# Patient Record
Sex: Male | Born: 1970 | Race: White | Hispanic: No | Marital: Single | Smoking: Never smoker
Health system: Southern US, Community
[De-identification: ages and names within clinical notes are randomized; demographics above are authoritative.]

## PROBLEM LIST (undated history)

## (undated) DIAGNOSIS — E119 Type 2 diabetes mellitus without complications: Secondary | ICD-10-CM

## (undated) DIAGNOSIS — E785 Hyperlipidemia, unspecified: Secondary | ICD-10-CM

## (undated) DIAGNOSIS — I10 Essential (primary) hypertension: Secondary | ICD-10-CM

## (undated) DIAGNOSIS — Z87442 Personal history of urinary calculi: Secondary | ICD-10-CM

## (undated) HISTORY — DX: Essential (primary) hypertension: I10

## (undated) HISTORY — DX: Personal history of urinary calculi: Z87.442

## (undated) HISTORY — DX: Type 2 diabetes mellitus without complications: E11.9

## (undated) HISTORY — DX: Hyperlipidemia, unspecified: E78.5

## (undated) HISTORY — PX: TONSILLECTOMY: SUR1361

---

## 2007-02-13 ENCOUNTER — Emergency Department (HOSPITAL_COMMUNITY): Admission: EM | Admit: 2007-02-13 | Discharge: 2007-02-13 | Payer: Self-pay | Admitting: Emergency Medicine

## 2008-07-13 IMAGING — CR DG FINGER THUMB 2+V*R*
1 series · 1 of 1 positions shown · non-contrast
Comparison: none

CLINICAL DATA: 36-year-old with laceration of the finger.
 RIGHT THUMB ? 3 VIEW:

[view not recorded]
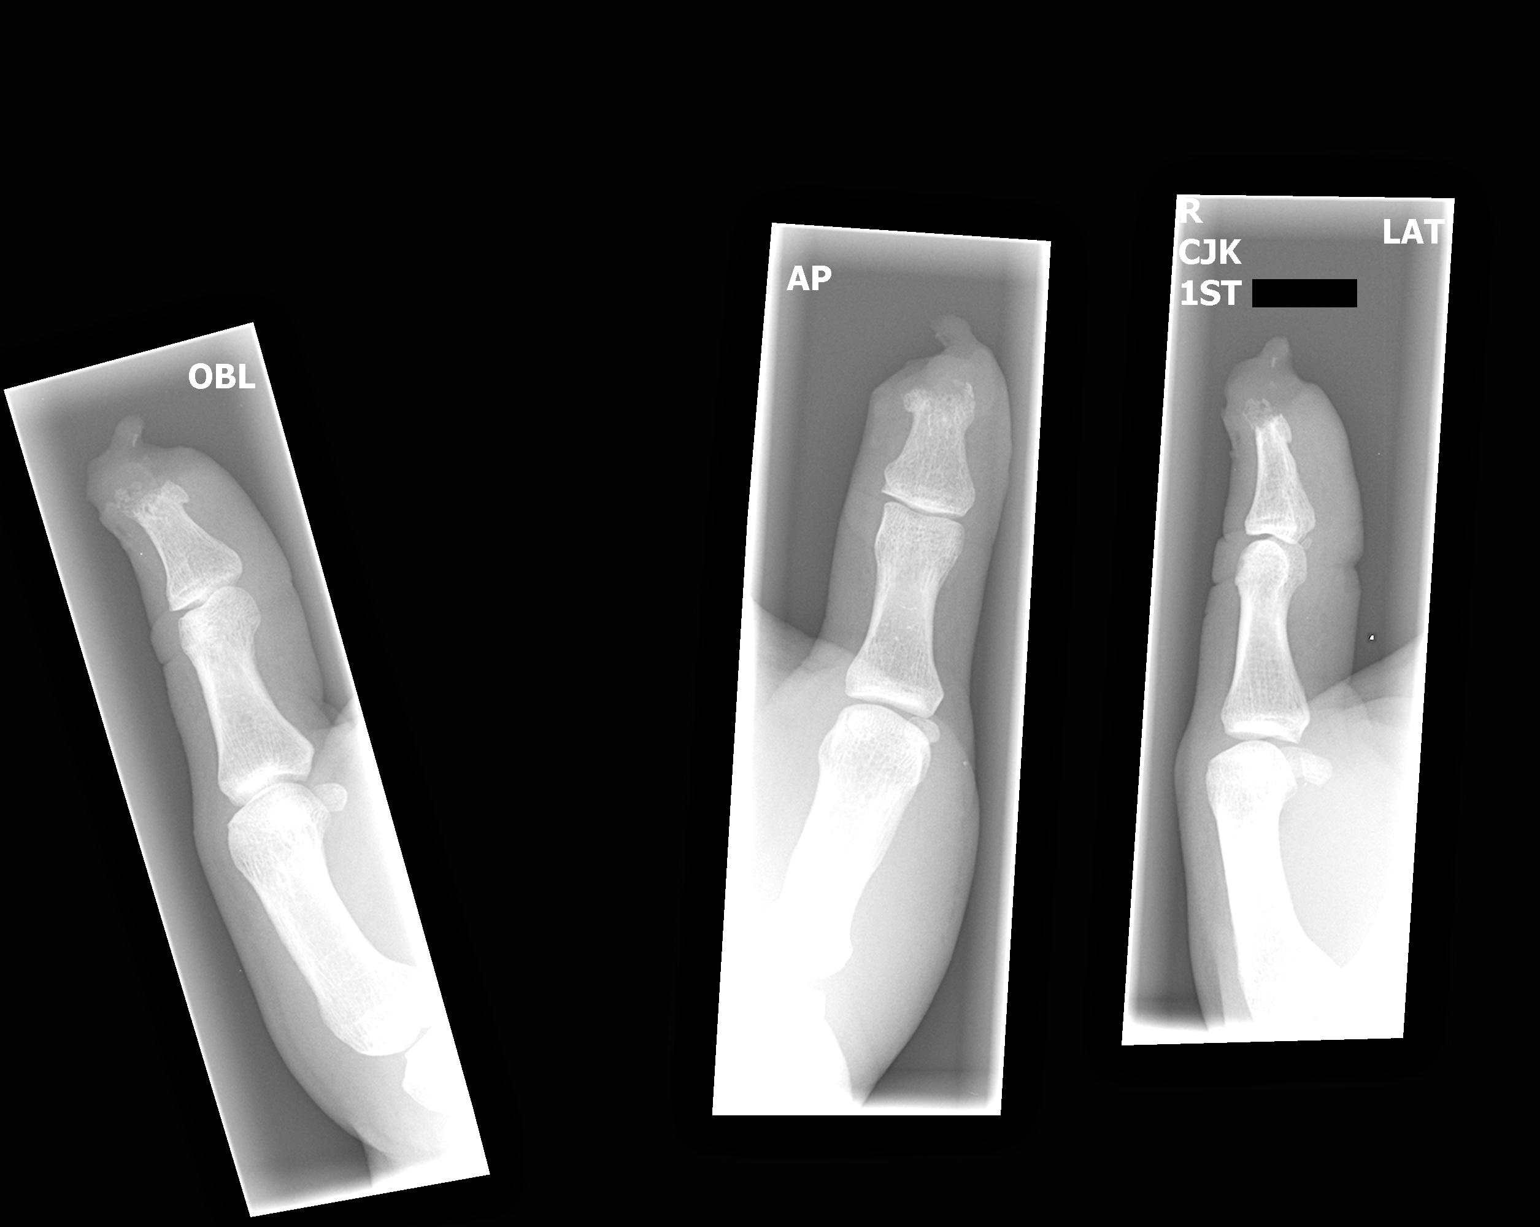

[1 of 1 positions shown; findings below may reference images not displayed]

FINDINGS: There is fracture involving the tuft of the right thumb with extensive soft tissue defect.  The proximal phalanx and thumb metacarpal are normal in appearance.
IMPRESSION: Laceration of the distal thumb associated with tuft fracture.

## 2010-11-19 NOTE — Consult Note (Signed)
NAMEKRISHAN, MCBREEN NO.:  [id number]   MEDICAL RECORD NO.:  [id number]          PATIENT TYPE:  EMS   LOCATION:  [facility]                         FACILITY:  [facility]   PHYSICIAN:  Madelynn Done, MD  DATE OF BIRTH:  [birth year]   DATE OF CONSULTATION:  02/13/2007  DATE OF DISCHARGE:  02/13/2007                                 CONSULTATION   REASON FOR CONSULTATION:  Right thumb injury.   REQUESTING PHYSICIAN:  Dr. Ethelda Chick, emergency floor.   BRIEF HISTORY:  [name] is a 40 year old right-hand dominant  gentleman who was at home working on his table saw when the table saw  kicked back, and he sustained an injury to his dominant right thumb.  The patient presented to the emergency department  with pain and  deformity to the tip of his right thumb.  I was consulted for the  management for his thumb tip injury.   The patient complained of pain to the thumb.  He also had a small  abrasion over the tip of his long finger.   PAST MEDICAL HISTORY:  Hypertension.   PAST SURGICAL HISTORY:  None.   SOCIAL HISTORY:  He is a nonsmoker.  Occasional alcohol.  He is married.  He works as a Banker.   MEDICATIONS:  Hypertensive medicine.   ALLERGIES:  Penicillin.   REVIEW OF SYSTEMS:  No recent illnesses or hospitalization.   PHYSICAL EXAMINATION:  He is an overweight white male in no acute  distress.  He is alert and oriented to person, place, and time.   On examination of the right hand, he has as an open wound to the distal  tip of his thumb.  He does have a large radial base flap with skin.  He  has a large radial base flap of the defect along the ulnar border of the  thumb tip.  There is a small portion of exposed distal phalanx.  The  nail plate has been nearly evulsed off.  He is able to extend his thumb  from a palm flat position, flex his thumb IP joint.  He is able to flex  all the other digits.  He has a small abrasion at the tip  of the long  finger.  He is able to flex and extend his wrist.  He has a good radial  pulse.  His thumb tip is bleeding and well-perfused.   RADIOGRAPHY:  AP and lateral films of the right thumb do show the open  distal phalanx fracture with the associated soft tissue injury, and no  fracture seen of the long finger.   IMPRESSION:  1. Right thumb open distal phalanx fracture with exposed bone and soft      tissue injury.  2. Right long finger abrasion   PLAN:  We talked about the treatment options for his thumb and covering  the bone over the tip of the thumb.  We talked about him having a likely  residual deformity to that thumb, and he may need further intervention  if we were unable to cover the defect.  The patient voiced understanding  the plan and elected to proceed with wound debridement and closure.  The  thumb was then anesthetized with 1% lidocaine 10 mL digital block.  The  patient tolerated this well.  It was thoroughly irrigated with saline  solution and Betadine in a pulsatile fashion.  The thumb was then  prepped with Betadine and then sterilely draped.  A small finger  tourniquet was then applied on the thumb.  Debridement of the skin,  subcutaneous tissue and bone was then carried out.  The nail plate was  removed.  After debridement of the open fracture, the ulnar base flap  was then advanced across the ulnar side, and a Kutler-type advancement  flap was formed in order to cover the defect.  There was good  advancement of the skin and subcutaneous tissues to close the defect.  It was closed with 5-0 Prolene simple sutures, as well as several 5-0  chromic sutures.  After the advancement flap and debridement of the open  fracture, Xeroform was then applied over the finger.  A sterile  compressive dressing was then applied.  The finger tourniquet was  removed prior to application of dressing was good perfusion of the thumb  tip.  The patient tolerated the procedure  well.  A small finger splint  was then applied.   The patient will be discharged to home.  I plan to see him back in the  office in about 6 days for wound check.  He is going out to [place], and  we will see him before he goes out on his trip.  He will be given a  prescription for oral antibiotics as well as pain medication.  He is not  to drive or operate heavy machinery when he is on the pain medication.  He is to complete the antibiotics.  He is to keep his bandage clean and  dry.  He may return to work with no use of the right thumb until I see  him back.  The patient's questions were answered prior to discharge.  We  talked about the side-effects of the medication.      Madelynn Done, MD  Electronically Signed     FWO/MEDQ  D:  02/13/2007  T:  02/15/2007  Job:  [id number]

## 2013-08-31 ENCOUNTER — Encounter: Payer: Self-pay | Admitting: Internal Medicine

## 2013-08-31 ENCOUNTER — Ambulatory Visit (INDEPENDENT_AMBULATORY_CARE_PROVIDER_SITE_OTHER): Payer: BC Managed Care – PPO | Admitting: Internal Medicine

## 2013-08-31 VITALS — BP 148/98 | HR 85 | Temp 97.8°F | Ht 71.0 in | Wt 230.5 lb

## 2013-08-31 DIAGNOSIS — E1165 Type 2 diabetes mellitus with hyperglycemia: Secondary | ICD-10-CM | POA: Insufficient documentation

## 2013-08-31 DIAGNOSIS — I10 Essential (primary) hypertension: Secondary | ICD-10-CM | POA: Insufficient documentation

## 2013-08-31 DIAGNOSIS — E11319 Type 2 diabetes mellitus with unspecified diabetic retinopathy without macular edema: Secondary | ICD-10-CM | POA: Insufficient documentation

## 2013-08-31 DIAGNOSIS — Z0000 Encounter for general adult medical examination without abnormal findings: Secondary | ICD-10-CM

## 2013-08-31 DIAGNOSIS — E119 Type 2 diabetes mellitus without complications: Secondary | ICD-10-CM | POA: Insufficient documentation

## 2013-08-31 DIAGNOSIS — E669 Obesity, unspecified: Secondary | ICD-10-CM | POA: Insufficient documentation

## 2013-08-31 MED ORDER — LOSARTAN POTASSIUM-HCTZ 50-12.5 MG PO TABS: 1.0000 | ORAL_TABLET | Freq: Every day | ORAL | Status: DC

## 2013-08-31 NOTE — Addendum Note (Signed)
Addended by: Alvina ChouWALSH, Trapper Meech J on: 08/31/2013 04:30 PM   Modules accepted: Orders

## 2013-08-31 NOTE — Progress Notes (Signed)
HPI  Pt presents to the clinic today to establish care. He is transferring care from Dr. Clarene DukeLittle. He has no had a CPE in 2 years. He has no concerns today.   Flu: never Tetanus: 2008 Eye Doctor: as needed Dentist: biannually  Past Medical History  Diagnosis Date  . Diabetes mellitus without complication   . Hypertension   . History of kidney stones     Current Outpatient Prescriptions  Medication Sig Dispense Refill  . losartan-hydrochlorothiazide (HYZAAR) 50-12.5 MG per tablet Take 1 tablet by mouth daily.      . Multiple Vitamins-Minerals (MULTIVITAMIN GUMMIES ADULT PO) Take 2 each by mouth daily.       No current facility-administered medications for this visit.    Allergies  Allergen Reactions  . Penicillins     Family History  Problem Relation Age of Onset  . Diabetes Mother   . Hypertension Mother   . Diabetes Father   . Hypertension Father   . Prostate cancer Father     History   Social History  . Marital Status: Single    Spouse Name: N/A    Number of Children: N/A  . Years of Education: N/A   Occupational History  . Not on file.   Social History Main Topics  . Smoking status: Never Smoker   . Smokeless tobacco: Not on file  . Alcohol Use: 3.0 oz/week    5 Cans of beer per week     Comment: occasional  . Drug Use: No  . Sexual Activity: Yes   Other Topics Concern  . Not on file   Social History Narrative  . No narrative on file    ROS:  Constitutional: Denies fever, malaise, fatigue, headache or abrupt weight changes.  HEENT: Denies eye pain, eye redness, ear pain, ringing in the ears, wax buildup, runny nose, nasal congestion, bloody nose, or sore throat. Respiratory: Denies difficulty breathing, shortness of breath, cough or sputum production.   Cardiovascular: Denies chest pain, chest tightness, palpitations or swelling in the hands or feet.  Gastrointestinal: Pt reports constipation. Denies abdominal pain, bloating, diarrhea or blood  in the stool.  GU: Denies frequency, urgency, pain with urination, blood in urine, odor or discharge. Musculoskeletal: Denies decrease in range of motion, difficulty with gait, muscle pain or joint pain and swelling.  Skin: Denies redness, rashes, lesions or ulcercations.  Neurological: Denies dizziness, difficulty with memory, difficulty with speech or problems with balance and coordination.   No other specific complaints in a complete review of systems (except as listed in HPI above).  PE:  BP 148/98  Pulse 85  Temp(Src) 97.8 F (36.6 C) (Oral)  Ht 5\' 11"  (1.803 m)  Wt 230 lb 8 oz (104.554 kg)  BMI 32.16 kg/m2  SpO2 98% Wt Readings from Last 3 Encounters:  08/31/13 230 lb 8 oz (104.554 kg)    General: Appears his stated age, obese but well developed, well nourished in NAD. HEENT: Head: normal shape and size; Eyes: sclera white, no icterus, conjunctiva pink, PERRLA and EOMs intact; Ears: Tm's gray and intact, normal light reflex; Nose: mucosa pink and moist, septum midline; Throat/Mouth: Teeth present, mucosa pink and moist, no lesions or ulcerations noted.  Neck: Normal range of motion. Neck supple, trachea midline. No massses, lumps or thyromegaly present.  Cardiovascular: Normal rate and rhythm. S1,S2 noted.  No murmur, rubs or gallops noted. No JVD or BLE edema. No carotid bruits noted. Pulmonary/Chest: Normal effort and positive vesicular breath sounds. No  respiratory distress. No wheezes, rales or ronchi noted.  Abdomen: Soft and nontender. Normal bowel sounds, no bruits noted. No distention or masses noted. Liver, spleen and kidneys non palpable. Musculoskeletal: Normal range of motion. No signs of joint swelling. No difficulty with gait.  Neurological: Alert and oriented. Cranial nerves II-XII intact. Coordination normal. +DTRs bilaterally. Psychiatric: Mood and affect normal. Behavior is normal. Judgment and thought content normal.     Assessment and Plan:  Preventative  Health Maintenance:  He declines flu shot today Will obtain screening labs today Encouraged him to work on diet and exercise  RTC in 6 months or sooner if needed

## 2013-08-31 NOTE — Assessment & Plan Note (Signed)
Well controlled on Hyzaar Will obtain CBC and CMET

## 2013-08-31 NOTE — Progress Notes (Signed)
Pre visit review using our clinic review tool, if applicable. No additional management support is needed unless otherwise documented below in the visit note. 

## 2013-08-31 NOTE — Assessment & Plan Note (Signed)
Will check A1C today.

## 2013-08-31 NOTE — Patient Instructions (Addendum)

## 2013-09-01 LAB — HEMOGLOBIN A1C
Hgb A1c MFr Bld: 7.8 % — ABNORMAL HIGH (ref <5.7)
Mean Plasma Glucose: 177 mg/dL — ABNORMAL HIGH (ref <117)

## 2013-09-01 LAB — COMPREHENSIVE METABOLIC PANEL
ALBUMIN: 4.9 g/dL (ref 3.5–5.2)
ALT: 26 U/L (ref 0–53)
AST: 27 U/L (ref 0–37)
Alkaline Phosphatase: 81 U/L (ref 39–117)
BILIRUBIN TOTAL: 1.2 mg/dL (ref 0.2–1.2)
BUN: 14 mg/dL (ref 6–23)
CO2: 25 meq/L (ref 19–32)
Calcium: 10 mg/dL (ref 8.4–10.5)
Chloride: 96 mEq/L (ref 96–112)
Creat: 0.88 mg/dL (ref 0.50–1.35)
GLUCOSE: 171 mg/dL — AB (ref 70–99)
Potassium: 3.9 mEq/L (ref 3.5–5.3)
SODIUM: 136 meq/L (ref 135–145)
TOTAL PROTEIN: 7.8 g/dL (ref 6.0–8.3)

## 2013-09-01 LAB — LIPID PANEL
CHOLESTEROL: 198 mg/dL (ref 0–200)
HDL: 38 mg/dL — AB (ref >39)
LDL Cholesterol: 121 mg/dL — ABNORMAL HIGH (ref 0–99)
TRIGLYCERIDES: 194 mg/dL — AB (ref <150)
Total CHOL/HDL Ratio: 5.2 Ratio
VLDL: 39 mg/dL (ref 0–40)

## 2013-09-01 LAB — CBC WITH DIFFERENTIAL/PLATELET
BASOS ABS: 0 10*3/uL (ref 0.0–0.1)
Basophils Relative: 0 % (ref 0–1)
Eosinophils Absolute: 0.1 10*3/uL (ref 0.0–0.7)
Eosinophils Relative: 1 % (ref 0–5)
HCT: 48.4 % (ref 39.0–52.0)
Hemoglobin: 17 g/dL (ref 13.0–17.0)
LYMPHS PCT: 23 % (ref 12–46)
Lymphs Abs: 1.9 10*3/uL (ref 0.7–4.0)
MCH: 30 pg (ref 26.0–34.0)
MCHC: 35.1 g/dL (ref 30.0–36.0)
MCV: 85.5 fL (ref 78.0–100.0)
Monocytes Absolute: 0.8 10*3/uL (ref 0.1–1.0)
Monocytes Relative: 10 % (ref 3–12)
NEUTROS ABS: 5.4 10*3/uL (ref 1.7–7.7)
Neutrophils Relative %: 66 % (ref 43–77)
PLATELETS: 184 10*3/uL (ref 150–400)
RBC: 5.66 MIL/uL (ref 4.22–5.81)
RDW: 13.6 % (ref 11.5–15.5)
WBC: 8.2 10*3/uL (ref 4.0–10.5)

## 2013-09-01 LAB — TSH: TSH: 1.8 u[IU]/mL (ref 0.350–4.500)

## 2013-09-02 ENCOUNTER — Other Ambulatory Visit: Payer: Self-pay | Admitting: Internal Medicine

## 2013-09-02 MED ORDER — METFORMIN HCL 1000 MG PO TABS
1000.0000 mg | ORAL_TABLET | Freq: Two times a day (BID) | ORAL | Status: DC
Start: 2013-09-02 — End: 2014-08-15

## 2013-10-13 ENCOUNTER — Other Ambulatory Visit: Payer: Self-pay

## 2014-03-08 ENCOUNTER — Encounter: Payer: Self-pay | Admitting: Internal Medicine

## 2014-03-08 ENCOUNTER — Ambulatory Visit (INDEPENDENT_AMBULATORY_CARE_PROVIDER_SITE_OTHER): Payer: BC Managed Care – PPO | Admitting: Internal Medicine

## 2014-03-08 ENCOUNTER — Ambulatory Visit: Payer: BC Managed Care – PPO | Admitting: Family Medicine

## 2014-03-08 VITALS — BP 140/88 | HR 87 | Temp 98.2°F | Wt 232.5 lb

## 2014-03-08 DIAGNOSIS — E119 Type 2 diabetes mellitus without complications: Secondary | ICD-10-CM

## 2014-03-08 DIAGNOSIS — E785 Hyperlipidemia, unspecified: Secondary | ICD-10-CM

## 2014-03-08 DIAGNOSIS — E669 Obesity, unspecified: Secondary | ICD-10-CM

## 2014-03-08 DIAGNOSIS — I10 Essential (primary) hypertension: Secondary | ICD-10-CM

## 2014-03-08 MED ORDER — LANCETS 30G MISC: 1.0000 | Freq: Every day | Status: DC

## 2014-03-08 MED ORDER — GLUCOSE BLOOD VI STRP: ORAL_STRIP | Status: DC

## 2014-03-08 NOTE — Patient Instructions (Signed)

## 2014-03-08 NOTE — Assessment & Plan Note (Signed)
Well controlled on Hyzaar Will check CBC and CMET today

## 2014-03-08 NOTE — Progress Notes (Signed)
Pre visit review using our clinic review tool, if applicable. No additional management support is needed unless otherwise documented below in the visit note. 

## 2014-03-08 NOTE — Assessment & Plan Note (Signed)
Will repeat lipid profile today If remains elevated, will consider starting statin

## 2014-03-08 NOTE — Assessment & Plan Note (Signed)
On Metformin He declines flu and pneumovax today Will repeat A1C and microalbumin today

## 2014-03-08 NOTE — Assessment & Plan Note (Signed)
Encouraged him to continue to work on diet and exercise 

## 2014-03-08 NOTE — Progress Notes (Signed)
Subjective:    Patient ID: Eric Sellers, male    DOB: [birth year], 43 y.o.   MRN: [id number]  HPI  Pt presents to the clinic today for follow up of DM2 and HTN. His BP is well controlled on Hyzaar. A1C 08/2013 was 7.8%. He was started on Metformin 1000 mg BID. He was supposed to return 11/2013 for a follow up but did not return until now. He reports that his sugars average about 125. He denies problems with vision, non healing wounds or numbness and tingling in the hands or feet. His LDL was slightly elevated at his last visit. He has been working on diet and exercise.  Review of Systems      Past Medical History  Diagnosis Date  . Diabetes mellitus without complication   . Hypertension   . History of kidney stones     Current Outpatient Prescriptions  Medication Sig Dispense Refill  . losartan-hydrochlorothiazide (HYZAAR) 50-12.5 MG per tablet Take 1 tablet by mouth daily.  90 tablet  3  . metFORMIN (GLUCOPHAGE) 1000 MG tablet Take 1 tablet (1,000 mg total) by mouth 2 (two) times daily with a meal.  180 tablet  3  . Multiple Vitamins-Minerals (MULTIVITAMIN GUMMIES ADULT PO) Take 2 each by mouth daily.       No current facility-administered medications for this visit.    Allergies  Allergen Reactions  . Penicillins     Family History  Problem Relation Age of Onset  . Diabetes Mother   . Hypertension Mother   . Diabetes Father   . Hypertension Father   . Prostate cancer Father     History   Social History  . Marital Status: Single    Spouse Name: N/A    Number of Children: N/A  . Years of Education: N/A   Occupational History  . Not on file.   Social History Main Topics  . Smoking status: Never Smoker   . Smokeless tobacco: Not on file  . Alcohol Use: 3.0 oz/week    5 Cans of beer per week     Comment: occasional  . Drug Use: No  . Sexual Activity: Yes   Other Topics Concern  . Not on file   Social History Narrative  . No narrative on file      Constitutional: Denies fever, malaise, fatigue, headache or abrupt weight changes.  Respiratory: Denies difficulty breathing, shortness of breath, cough or sputum production.   Cardiovascular: Denies chest pain, chest tightness, palpitations or swelling in the hands or feet.  Gastrointestinal: Denies abdominal pain, bloating, constipation, diarrhea or blood in the stool.   Skin: Denies redness, rashes, lesions or ulcercations.  Neurological: Denies dizziness, difficulty with memory, difficulty with speech or problems with balance and coordination.   No other specific complaints in a complete review of systems (except as listed in HPI above).  Objective:   Physical Exam   BP 140/88  Pulse 87  Temp(Src) 98.2 F (36.8 C) (Oral)  Wt 232 lb 8 oz (105.461 kg)  SpO2 97% Wt Readings from Last 3 Encounters:  03/08/14 232 lb 8 oz (105.461 kg)  08/31/13 230 lb 8 oz (104.554 kg)    General: Appears his stated age, obese but well developed, well nourished in NAD. Cardiovascular: Normal rate and rhythm. S1,S2 noted.  No murmur, rubs or gallops noted.  Pulmonary/Chest: Normal effort and positive vesicular breath sounds. No respiratory distress. No wheezes, rales or ronchi noted.  Abdomen: Soft and nontender. Normal bowel sounds,  no bruits noted. No distention or masses noted. Liver, spleen and kidneys non palpable.   BMET    Component Value Date/Time   NA 136 08/31/2013 1630   K 3.9 08/31/2013 1630   CL 96 08/31/2013 1630   CO2 25 08/31/2013 1630   GLUCOSE 171* 08/31/2013 1630   BUN 14 08/31/2013 1630   CREATININE 0.88 08/31/2013 1630   CALCIUM 10.0 08/31/2013 1630    Lipid Panel     Component Value Date/Time   CHOL 198 08/31/2013 1630   TRIG 194* 08/31/2013 1630   HDL 38* 08/31/2013 1630   CHOLHDL 5.2 08/31/2013 1630   VLDL 39 08/31/2013 1630   LDLCALC 121* 08/31/2013 1630    CBC    Component Value Date/Time   WBC 8.2 08/31/2013 1630   RBC 5.66 08/31/2013 1630   HGB 17.0  08/31/2013 1630   HCT 48.4 08/31/2013 1630   PLT 184 08/31/2013 1630   MCV 85.5 08/31/2013 1630   MCH 30.0 08/31/2013 1630   MCHC 35.1 08/31/2013 1630   RDW 13.6 08/31/2013 1630   LYMPHSABS 1.9 08/31/2013 1630   MONOABS 0.8 08/31/2013 1630   EOSABS 0.1 08/31/2013 1630   BASOSABS 0.0 08/31/2013 1630    Hgb A1C Lab Results  Component Value Date   HGBA1C 7.8* 08/31/2013        Assessment & Plan:

## 2014-03-09 LAB — CBC
HCT: 47.9 % (ref 39.0–52.0)
Hemoglobin: 16.5 g/dL (ref 13.0–17.0)
MCHC: 34.4 g/dL (ref 30.0–36.0)
MCV: 86.7 fl (ref 78.0–100.0)
PLATELETS: 187 10*3/uL (ref 150.0–400.0)
RBC: 5.53 Mil/uL (ref 4.22–5.81)
RDW: 12.5 % (ref 11.5–15.5)
WBC: 7.8 10*3/uL (ref 4.0–10.5)

## 2014-03-09 LAB — LIPID PANEL
CHOLESTEROL: 207 mg/dL — AB (ref 0–200)
HDL: 30.1 mg/dL — ABNORMAL LOW (ref >39.00)
NONHDL: 176.9
TRIGLYCERIDES: 237 mg/dL — AB (ref 0.0–149.0)
Total CHOL/HDL Ratio: 7
VLDL: 47.4 mg/dL — ABNORMAL HIGH (ref 0.0–40.0)

## 2014-03-09 LAB — COMPREHENSIVE METABOLIC PANEL
ALBUMIN: 4.7 g/dL (ref 3.5–5.2)
ALT: 39 U/L (ref 0–53)
AST: 34 U/L (ref 0–37)
Alkaline Phosphatase: 71 U/L (ref 39–117)
BUN: 16 mg/dL (ref 6–23)
CALCIUM: 9.7 mg/dL (ref 8.4–10.5)
CHLORIDE: 102 meq/L (ref 96–112)
CO2: 27 mEq/L (ref 19–32)
Creatinine, Ser: 1.1 mg/dL (ref 0.4–1.5)
GFR: 81.4 mL/min (ref >60.00)
GLUCOSE: 143 mg/dL — AB (ref 70–99)
POTASSIUM: 4.3 meq/L (ref 3.5–5.1)
Sodium: 139 mEq/L (ref 135–145)
Total Bilirubin: 1.6 mg/dL — ABNORMAL HIGH (ref 0.2–1.2)
Total Protein: 8.1 g/dL (ref 6.0–8.3)

## 2014-03-09 LAB — LDL CHOLESTEROL, DIRECT: Direct LDL: 138.8 mg/dL

## 2014-03-09 LAB — MICROALBUMIN / CREATININE URINE RATIO
CREATININE, U: 402.2 mg/dL
MICROALB UR: 3.4 mg/dL — AB (ref 0.0–1.9)
MICROALB/CREAT RATIO: 0.8 mg/g (ref 0.0–30.0)

## 2014-03-09 LAB — HEMOGLOBIN A1C: Hgb A1c MFr Bld: 7.5 % — ABNORMAL HIGH (ref 4.6–6.5)

## 2014-03-10 ENCOUNTER — Other Ambulatory Visit: Payer: Self-pay

## 2014-03-10 MED ORDER — GLIPIZIDE 10 MG PO TABS: 10.0000 mg | ORAL_TABLET | Freq: Every day | ORAL | Status: DC

## 2014-06-26 ENCOUNTER — Encounter: Payer: Self-pay | Admitting: Internal Medicine

## 2014-06-26 ENCOUNTER — Ambulatory Visit (INDEPENDENT_AMBULATORY_CARE_PROVIDER_SITE_OTHER): Payer: BC Managed Care – PPO | Admitting: Internal Medicine

## 2014-06-26 VITALS — BP 136/86 | HR 108 | Temp 98.2°F | Wt 245.0 lb

## 2014-06-26 DIAGNOSIS — J069 Acute upper respiratory infection, unspecified: Secondary | ICD-10-CM

## 2014-06-26 MED ORDER — HYDROCODONE-HOMATROPINE 5-1.5 MG/5ML PO SYRP
5.0000 mL | ORAL_SOLUTION | Freq: Three times a day (TID) | ORAL | Status: DC | PRN
Start: 2014-06-26 — End: 2014-09-19

## 2014-06-26 MED ORDER — AZITHROMYCIN 250 MG PO TABS: ORAL_TABLET | ORAL | Status: DC

## 2014-06-26 NOTE — Progress Notes (Signed)
HPI  Pt presents to the clinic today with c/o cough. He reports this started 10 days ago. The cough is productive of thick brown mucous. He has had some associated headache. He denies fever, chills and body aches. He has tried Mucinex, Theraflu, Dayquil and Nyquil without much relief. He has no history of allergies or breathing problems. He has had sick contacts.  Review of Systems      Past Medical History  Diagnosis Date  . Diabetes mellitus without complication   . Hypertension   . History of kidney stones     Family History  Problem Relation Age of Onset  . Diabetes Mother   . Hypertension Mother   . Diabetes Father   . Hypertension Father   . Prostate cancer Father     History   Social History  . Marital Status: Single    Spouse Name: N/A    Number of Children: N/A  . Years of Education: N/A   Occupational History  . Not on file.   Social History Main Topics  . Smoking status: Never Smoker   . Smokeless tobacco: Not on file  . Alcohol Use: 3.0 oz/week    5 Cans of beer per week     Comment: occasional  . Drug Use: No  . Sexual Activity: Yes   Other Topics Concern  . Not on file   Social History Narrative    Allergies  Allergen Reactions  . Penicillins      Constitutional: Positive headache. Denies fatigue, fever or abrupt weight changes.  HEENT:  Positive sore throat. Denies eye redness, eye pain, pressure behind the eyes, facial pain, nasal congestion, ear pain, ringing in the ears, wax buildup, runny nose or bloody nose. Respiratory: Positive cough. Denies difficulty breathing or shortness of breath.  Cardiovascular: Denies chest pain, chest tightness, palpitations or swelling in the hands or feet.   No other specific complaints in a complete review of systems (except as listed in HPI above).  Objective:   BP 136/86 mmHg  Pulse 108  Temp(Src) 98.2 F (36.8 C) (Oral)  Wt 245 lb (111.131 kg)  SpO2 97% Wt Readings from Last 3 Encounters:   06/26/14 245 lb (111.131 kg)  03/08/14 232 lb 8 oz (105.461 kg)  08/31/13 230 lb 8 oz (104.554 kg)     General: Appears his stated age,  in NAD. HEENT: Head: normal shape and size, no sinus tenderness noted; Eyes: sclera white, no icterus, conjunctiva pink; Ears: Tm's gray and intact, normal light reflex; Nose: mucosa pink and moist, septum midline; Throat/Mouth: + PND. Teeth present, mucosa erythematous and moist, no exudate noted, no lesions or ulcerations noted.  Neck: No adenopathy noted. Cardiovascular: Tachycardic with normal rhythm. S1,S2 noted.  No murmur, rubs or gallops noted.  Pulmonary/Chest: Normal effort and positive vesicular breath sounds. No respiratory distress. No wheezes, rales or ronchi noted.      Assessment & Plan:   Upper Respiratory Infection:  Get some rest and drink plenty of water Do salt water gargles for the sore throat eRx for Azithromax x 5 days eRx for Hycodan cough syrup  RTC as needed or if symptoms persist.

## 2014-06-26 NOTE — Progress Notes (Signed)
Pre visit review using our clinic review tool, if applicable. No additional management support is needed unless otherwise documented below in the visit note. 

## 2014-06-26 NOTE — Patient Instructions (Signed)
Cough, Adult  A cough is a reflex that helps clear your throat and airways. It can help heal the body or may be a reaction to an irritated airway. A cough may only last 2 or 3 weeks (acute) or may last more than 8 weeks (chronic).  CAUSES Acute cough:  Viral or bacterial infections. Chronic cough:  Infections.  Allergies.  Asthma.  Post-nasal drip.  Smoking.  Heartburn or acid reflux.  Some medicines.  Chronic lung problems (COPD).  Cancer. SYMPTOMS   Cough.  Fever.  Chest pain.  Increased breathing rate.  High-pitched whistling sound when breathing (wheezing).  Colored mucus that you cough up (sputum). TREATMENT   A bacterial cough may be treated with antibiotic medicine.  A viral cough must run its course and will not respond to antibiotics.  Your caregiver may recommend other treatments if you have a chronic cough. HOME CARE INSTRUCTIONS   Only take over-the-counter or prescription medicines for pain, discomfort, or fever as directed by your caregiver. Use cough suppressants only as directed by your caregiver.  Use a cold steam vaporizer or humidifier in your bedroom or home to help loosen secretions.  Sleep in a semi-upright position if your cough is worse at night.  Rest as needed.  Stop smoking if you smoke. SEEK IMMEDIATE MEDICAL CARE IF:   You have pus in your sputum.  Your cough starts to worsen.  You cannot control your cough with suppressants and are losing sleep.  You begin coughing up blood.  You have difficulty breathing.  You develop pain which is getting worse or is uncontrolled with medicine.  You have a fever. MAKE SURE YOU:   Understand these instructions.  Will watch your condition.  Will get help right away if you are not doing well or get worse. Document Released: 12/20/2010 Document Revised: 09/15/2011 Document Reviewed: 12/20/2010 Exit[employer] Patient Information 2015 [employer]. This information is not intended  to replace advice given to you by your health care provider. Make sure you discuss any questions you have with your health care provider.  

## 2014-07-26 ENCOUNTER — Telehealth: Payer: Self-pay | Admitting: Internal Medicine

## 2014-07-26 ENCOUNTER — Other Ambulatory Visit: Payer: Self-pay | Admitting: Internal Medicine

## 2014-07-26 DIAGNOSIS — E119 Type 2 diabetes mellitus without complications: Secondary | ICD-10-CM

## 2014-07-26 DIAGNOSIS — E785 Hyperlipidemia, unspecified: Secondary | ICD-10-CM

## 2014-07-26 NOTE — Telephone Encounter (Signed)
Chistine pt partner called to schedule labs.  No order in system.  When does pt need to come back for labs?

## 2014-07-26 NOTE — Telephone Encounter (Signed)
I need to see him in the middle of march. He can have labs done at the beginning of march. I will go ahead and order them.

## 2014-07-27 NOTE — Telephone Encounter (Signed)
Spoke to Bancrofthristine per HIPAA and she is aware as instructed--she states she will have pt call to make appt on his own

## 2014-08-15 ENCOUNTER — Other Ambulatory Visit: Payer: Self-pay | Admitting: Internal Medicine

## 2014-09-19 ENCOUNTER — Ambulatory Visit (INDEPENDENT_AMBULATORY_CARE_PROVIDER_SITE_OTHER): Payer: BLUE CROSS/BLUE SHIELD | Admitting: Internal Medicine

## 2014-09-19 ENCOUNTER — Encounter: Payer: Self-pay | Admitting: Internal Medicine

## 2014-09-19 VITALS — BP 136/92 | HR 92 | Temp 98.1°F | Wt 230.0 lb

## 2014-09-19 DIAGNOSIS — E785 Hyperlipidemia, unspecified: Secondary | ICD-10-CM

## 2014-09-19 DIAGNOSIS — J069 Acute upper respiratory infection, unspecified: Secondary | ICD-10-CM

## 2014-09-19 DIAGNOSIS — E669 Obesity, unspecified: Secondary | ICD-10-CM

## 2014-09-19 DIAGNOSIS — E119 Type 2 diabetes mellitus without complications: Secondary | ICD-10-CM

## 2014-09-19 DIAGNOSIS — I10 Essential (primary) hypertension: Secondary | ICD-10-CM

## 2014-09-19 MED ORDER — AZITHROMYCIN 250 MG PO TABS: ORAL_TABLET | ORAL | Status: DC

## 2014-09-19 MED ORDER — HYDROCODONE-HOMATROPINE 5-1.5 MG/5ML PO SYRP: 5.0000 mL | ORAL_SOLUTION | Freq: Three times a day (TID) | ORAL | Status: DC | PRN

## 2014-09-19 NOTE — Assessment & Plan Note (Signed)
Encouraged him to work on diet and exercise 

## 2014-09-19 NOTE — Assessment & Plan Note (Signed)
Advised him to consume a low fat diet Handout given on low fat diet Will repeat Lipid Profile and CMET today He is okay starting a statin if cholesterol remains elevated

## 2014-09-19 NOTE — Progress Notes (Signed)
Pre visit review using our clinic review tool, if applicable. No additional management support is needed unless otherwise documented below in the visit note. 

## 2014-09-19 NOTE — Progress Notes (Signed)
Subjective:    Patient ID: Eric Sellers, male    DOB: [birth year], 44 y.o.   MRN: [id number]  HPI  Pt presents to the clinic today for 6 month follow up of chronic medical conditoins.  DM 2: His blood sugars range 125- 160. He is taking Metformin and Glipizide as directed. He does not take flu shots. He has never had a pneumonia shot. His last eye exam was 02/2014.  HLD: His last LDL was 138.8. He wanted to try to work on a low fat diet but reports  He has not made any changes to his diet. He is not exercising.  HTN: BP well controlled on Losartan HCT. His BP is slightly elevated today at 136/92 but he reports he has been taking cold medicine.  Obesity: He has lost 2.5 lbs since his last visit.   Addtionally, he c/o a cough. This started 2 weeks ago. The cough is productive of brown mucous. He denies fever, chills or body aches. He had tried Nyquil cold and flu without relief. He has had sick contacts. He has recently traveled to [place] for a week long trip. He has no history of allergies or breathing problems.  Review of Systems      Past Medical History  Diagnosis Date  . Diabetes mellitus without complication   . Hypertension   . History of kidney stones     Current Outpatient Prescriptions  Medication Sig Dispense Refill  . glipiZIDE (GLUCOTROL) 10 MG tablet Take 1 tablet (10 mg total) by mouth daily before breakfast. 60 tablet 3  . glucose blood (ONE TOUCH TEST STRIPS) test strip One Touch Ultra 2 strips--250.00 50 each 5  . HYDROcodone-homatropine (HYCODAN) 5-1.5 MG/5ML syrup Take 5 mLs by mouth every 8 (eight) hours as needed for cough. 240 mL 0  . Lancets 30G MISC 1 each by Does not apply route daily. 250.00 100 each 2  . losartan-hydrochlorothiazide (HYZAAR) 50-12.5 MG per tablet Take 1 tablet by mouth daily. NEEDS TO MAKE AN OFFICE VISIT 90 tablet 0  . metFORMIN (GLUCOPHAGE) 1000 MG tablet Take 1 tablet (1,000 mg total) by mouth 2 (two) times daily with a meal. NEEDS  TO MAKE AN OFFICE VISIT 180 tablet 0  . Multiple Vitamins-Minerals (MULTIVITAMIN GUMMIES ADULT PO) Take 2 each by mouth daily.    Marland Kitchen azithromycin (ZITHROMAX) 250 MG tablet Take 2 tabs today, then 1 tab daily x 4 days 6 tablet 0   No current facility-administered medications for this visit.    Allergies  Allergen Reactions  . Penicillins     Family History  Problem Relation Age of Onset  . Diabetes Mother   . Hypertension Mother   . Diabetes Father   . Hypertension Father   . Prostate cancer Father     History   Social History  . Marital Status: Single    Spouse Name: N/A  . Number of Children: N/A  . Years of Education: N/A   Occupational History  . Not on file.   Social History Main Topics  . Smoking status: Never Smoker   . Smokeless tobacco: Not on file  . Alcohol Use: 3.0 oz/week    5 Cans of beer per week     Comment: occasional  . Drug Use: No  . Sexual Activity: Yes   Other Topics Concern  . Not on file   Social History Narrative     Constitutional: Pt reports headache. Denies fever, malaise, fatigue or abrupt weight changes.  HEENT: Denies eye pain, eye redness, ear pain, ringing in the ears, wax buildup, runny nose, nasal congestion, bloody nose, or sore throat. Respiratory: Pt reports cough. Denies difficulty breathing, shortness of breath.   Cardiovascular: Denies chest pain, chest tightness, palpitations or swelling in the hands or feet.  Gastrointestinal: Denies abdominal pain, bloating, constipation, diarrhea or blood in the stool.  Skin: Denies redness, rashes, lesions or ulcercations.  Neurological: Denies dizziness, difficulty with memory, difficulty with speech or problems with balance and coordination.  Psych: Pt denies anxiety, depression, SI/HI.  No other specific complaints in a complete review of systems (except as listed in HPI above).  Objective:   Physical Exam   BP 136/92 mmHg  Pulse 92  Temp(Src) 98.1 F (36.7 C) (Oral)  Wt  230 lb (104.327 kg)  SpO2 98% Wt Readings from Last 3 Encounters:  09/19/14 230 lb (104.327 kg)  06/26/14 245 lb (111.131 kg)  03/08/14 232 lb 8 oz (105.461 kg)    General: Appears his stated age, obese in NAD. Skin: Warm, dry and intact. No rashes, lesions or ulcerations noted. HEENT: Head: normal shape and size, no sinus tenderness noted; Eyes: sclera white, no icterus, conjunctiva pink, PERRLA and EOMs intact; Ears: Tm's gray and intact, normal light reflex; Throat/Mouth: Teeth present, mucosa erythematous and moist, no exudate, lesions or ulcerations noted.  Neck: No adenopathy noted. Cardiovascular: Normal rate and rhythm. S1,S2 noted.  No murmur, rubs or gallops noted. No JVD or BLE edema. No carotid bruits noted. Pulmonary/Chest: Normal effort and positive vesicular breath sounds. No respiratory distress. No wheezes, rales or ronchi noted.  Neurological: Alert and oriented.  Psychiatric: Mood and affect normal. Behavior is normal. Judgment and thought content normal.     BMET    Component Value Date/Time   NA 139 03/08/2014 1622   K 4.3 03/08/2014 1622   CL 102 03/08/2014 1622   CO2 27 03/08/2014 1622   GLUCOSE 143* 03/08/2014 1622   BUN 16 03/08/2014 1622   CREATININE 1.1 03/08/2014 1622   CREATININE 0.88 08/31/2013 1630   CALCIUM 9.7 03/08/2014 1622    Lipid Panel     Component Value Date/Time   CHOL 207* 03/08/2014 1622   TRIG 237.0* 03/08/2014 1622   HDL 30.10* 03/08/2014 1622   CHOLHDL 7 03/08/2014 1622   VLDL 47.4* 03/08/2014 1622   LDLCALC 121* 08/31/2013 1630    CBC    Component Value Date/Time   WBC 7.8 03/08/2014 1622   RBC 5.53 03/08/2014 1622   HGB 16.5 03/08/2014 1622   HCT 47.9 03/08/2014 1622   PLT 187.0 03/08/2014 1622   MCV 86.7 03/08/2014 1622   MCH 30.0 08/31/2013 1630   MCHC 34.4 03/08/2014 1622   RDW 12.5 03/08/2014 1622   LYMPHSABS 1.9 08/31/2013 1630   MONOABS 0.8 08/31/2013 1630   EOSABS 0.1 08/31/2013 1630   BASOSABS 0.0  08/31/2013 1630    Hgb A1C Lab Results  Component Value Date   HGBA1C 7.5* 03/08/2014        Assessment & Plan:   Upper respiratory infection:  eRx for Azithromax x 5 days RX for Hycodan cough syrup Tylenol/Ibuprfen for body aches  RTC in 6 months for physical exam

## 2014-09-19 NOTE — Patient Instructions (Signed)
Upper Respiratory Infection, Adult An upper respiratory infection (URI) is also sometimes known as the common cold. The upper respiratory tract includes the nose, sinuses, throat, trachea, and bronchi. Bronchi are the airways leading to the lungs. Most people improve within 1 week, but symptoms can last up to 2 weeks. A residual cough may last even longer.  CAUSES Many different viruses can infect the tissues lining the upper respiratory tract. The tissues become irritated and inflamed and often become very moist. Mucus production is also common. A cold is contagious. You can easily spread the virus to others by oral contact. This includes kissing, sharing a glass, coughing, or sneezing. Touching your mouth or nose and then touching a surface, which is then touched by another person, can also spread the virus. SYMPTOMS  Symptoms typically develop 1 to 3 days after you come in contact with a cold virus. Symptoms vary from person to person. They may include:  Runny nose.  Sneezing.  Nasal congestion.  Sinus irritation.  Sore throat.  Loss of voice (laryngitis).  Cough.  Fatigue.  Muscle aches.  Loss of appetite.  Headache.  Low-grade fever. DIAGNOSIS  You might diagnose your own cold based on familiar symptoms, since most people get a cold 2 to 3 times a year. Your caregiver can confirm this based on your exam. Most importantly, your caregiver can check that your symptoms are not due to another disease such as strep throat, sinusitis, pneumonia, asthma, or epiglottitis. Blood tests, throat tests, and X-rays are not necessary to diagnose a common cold, but they may sometimes be helpful in excluding other more serious diseases. Your caregiver will decide if any further tests are required. RISKS AND COMPLICATIONS  You may be at risk for a more severe case of the common cold if you smoke cigarettes, have chronic heart disease (such as heart failure) or lung disease (such as asthma), or if  you have a weakened immune system. The very young and very old are also at risk for more serious infections. Bacterial sinusitis, middle ear infections, and bacterial pneumonia can complicate the common cold. The common cold can worsen asthma and chronic obstructive pulmonary disease (COPD). Sometimes, these complications can require emergency medical care and may be life-threatening. PREVENTION  The best way to protect against getting a cold is to practice good hygiene. Avoid oral or hand contact with people with cold symptoms. Wash your hands often if contact occurs. There is no clear evidence that vitamin C, vitamin E, echinacea, or exercise reduces the chance of developing a cold. However, it is always recommended to get plenty of rest and practice good nutrition. TREATMENT  Treatment is directed at relieving symptoms. There is no cure. Antibiotics are not effective, because the infection is caused by a virus, not by bacteria. Treatment may include:  Increased fluid intake. Sports drinks offer valuable electrolytes, sugars, and fluids.  Breathing heated mist or steam (vaporizer or shower).  Eating chicken soup or other clear broths, and maintaining good nutrition.  Getting plenty of rest.  Using gargles or lozenges for comfort.  Controlling fevers with ibuprofen or acetaminophen as directed by your caregiver.  Increasing usage of your inhaler if you have asthma. Zinc gel and zinc lozenges, taken in the first 24 hours of the common cold, can shorten the duration and lessen the severity of symptoms. Pain medicines may help with fever, muscle aches, and throat pain. A variety of non-prescription medicines are available to treat congestion and runny nose. Your caregiver   can make recommendations and may suggest nasal or lung inhalers for other symptoms.  HOME CARE INSTRUCTIONS   Only take over-the-counter or prescription medicines for pain, discomfort, or fever as directed by your  caregiver.  Use a warm mist humidifier or inhale steam from a shower to increase air moisture. This may keep secretions moist and make it easier to breathe.  Drink enough water and fluids to keep your urine clear or pale yellow.  Rest as needed.  Return to work when your temperature has returned to normal or as your caregiver advises. You may need to stay home longer to avoid infecting others. You can also use a face mask and careful hand washing to prevent spread of the virus. SEEK MEDICAL CARE IF:   After the first few days, you feel you are getting worse rather than better.  You need your caregiver's advice about medicines to control symptoms.  You develop chills, worsening shortness of breath, or brown or red sputum. These may be signs of pneumonia.  You develop yellow or brown nasal discharge or pain in the face, especially when you bend forward. These may be signs of sinusitis.  You develop a fever, swollen neck glands, pain with swallowing, or white areas in the back of your throat. These may be signs of strep throat. SEEK IMMEDIATE MEDICAL CARE IF:   You have a fever.  You develop severe or persistent headache, ear pain, sinus pain, or chest pain.  You develop wheezing, a prolonged cough, cough up blood, or have a change in your usual mucus (if you have chronic lung disease).  You develop sore muscles or a stiff neck. Document Released: 12/17/2000 Document Revised: 09/15/2011 Document Reviewed: 09/28/2013 [employer] Patient Information 2015 [employer]. This information is not intended to replace advice given to you by your health care provider. Make sure you discuss any questions you have with your health care provider.  

## 2014-09-19 NOTE — Assessment & Plan Note (Signed)
BP well controlled on Losartan HCT Will check CBC and CMET today

## 2014-09-19 NOTE — Assessment & Plan Note (Signed)
Will repeat A1C and microalbumin today Advised him to continue Metformin and Glipizide at this time He declines flu and pneumonia shot Foot exam today

## 2014-09-20 LAB — MICROALBUMIN / CREATININE URINE RATIO
CREATININE, U: 160.9 mg/dL
MICROALB/CREAT RATIO: 0.4 mg/g (ref 0.0–30.0)
Microalb, Ur: <0.7 mg/dL (ref 0.0–1.9)

## 2014-09-20 LAB — COMPREHENSIVE METABOLIC PANEL
ALT: 29 U/L (ref 0–53)
AST: 24 U/L (ref 0–37)
Albumin: 4.6 g/dL (ref 3.5–5.2)
Alkaline Phosphatase: 85 U/L (ref 39–117)
BUN: 13 mg/dL (ref 6–23)
CHLORIDE: 101 meq/L (ref 96–112)
CO2: 29 meq/L (ref 19–32)
Calcium: 9.8 mg/dL (ref 8.4–10.5)
Creatinine, Ser: 0.9 mg/dL (ref 0.40–1.50)
GFR: 97.01 mL/min (ref >60.00)
GLUCOSE: 122 mg/dL — AB (ref 70–99)
POTASSIUM: 4.2 meq/L (ref 3.5–5.1)
SODIUM: 138 meq/L (ref 135–145)
Total Bilirubin: 0.8 mg/dL (ref 0.2–1.2)
Total Protein: 7.5 g/dL (ref 6.0–8.3)

## 2014-09-20 LAB — CBC
HEMATOCRIT: 45.1 % (ref 39.0–52.0)
HEMOGLOBIN: 15.7 g/dL (ref 13.0–17.0)
MCHC: 34.9 g/dL (ref 30.0–36.0)
MCV: 83.6 fl (ref 78.0–100.0)
PLATELETS: 245 10*3/uL (ref 150.0–400.0)
RBC: 5.39 Mil/uL (ref 4.22–5.81)
RDW: 13.1 % (ref 11.5–15.5)
WBC: 8.1 10*3/uL (ref 4.0–10.5)

## 2014-09-20 LAB — LIPID PANEL
CHOLESTEROL: 170 mg/dL (ref 0–200)
HDL: 30.8 mg/dL — AB (ref >39.00)
LDL Cholesterol: 102 mg/dL — ABNORMAL HIGH (ref 0–99)
NONHDL: 139.2
Total CHOL/HDL Ratio: 6
Triglycerides: 187 mg/dL — ABNORMAL HIGH (ref 0.0–149.0)
VLDL: 37.4 mg/dL (ref 0.0–40.0)

## 2014-09-20 LAB — HEMOGLOBIN A1C: Hgb A1c MFr Bld: 7 % — ABNORMAL HIGH (ref 4.6–6.5)

## 2014-09-25 MED ORDER — GLIPIZIDE 10 MG PO TABS: 10.0000 mg | ORAL_TABLET | Freq: Two times a day (BID) | ORAL | Status: DC

## 2014-09-25 NOTE — Addendum Note (Signed)
Addended by: Roena MaladyEVONTENNO, Bertie Simien Y on: 09/25/2014 01:40 PM   Modules accepted: Orders, Medications

## 2014-09-28 MED ORDER — GLIPIZIDE 10 MG PO TABS: 10.0000 mg | ORAL_TABLET | Freq: Two times a day (BID) | ORAL | Status: DC

## 2014-09-28 MED ORDER — METFORMIN HCL 1000 MG PO TABS: 1000.0000 mg | ORAL_TABLET | Freq: Two times a day (BID) | ORAL | Status: DC

## 2014-09-28 MED ORDER — LOSARTAN POTASSIUM-HCTZ 50-12.5 MG PO TABS: 1.0000 | ORAL_TABLET | Freq: Every day | ORAL | Status: DC

## 2014-09-28 NOTE — Addendum Note (Signed)
Addended by: Roena MaladyEVONTENNO, Shaqueta Casady Y on: 09/28/2014 05:22 PM   Modules accepted: Orders

## 2015-02-16 ENCOUNTER — Encounter: Payer: BLUE CROSS/BLUE SHIELD | Admitting: Internal Medicine

## 2015-02-19 ENCOUNTER — Ambulatory Visit (INDEPENDENT_AMBULATORY_CARE_PROVIDER_SITE_OTHER): Payer: BLUE CROSS/BLUE SHIELD | Admitting: Internal Medicine

## 2015-02-19 ENCOUNTER — Encounter: Payer: Self-pay | Admitting: Internal Medicine

## 2015-02-19 VITALS — BP 138/82 | HR 102 | Temp 98.3°F | Ht 70.5 in | Wt 240.5 lb

## 2015-02-19 DIAGNOSIS — E119 Type 2 diabetes mellitus without complications: Secondary | ICD-10-CM | POA: Diagnosis not present

## 2015-02-19 DIAGNOSIS — I10 Essential (primary) hypertension: Secondary | ICD-10-CM

## 2015-02-19 DIAGNOSIS — E669 Obesity, unspecified: Secondary | ICD-10-CM

## 2015-02-19 DIAGNOSIS — Z0000 Encounter for general adult medical examination without abnormal findings: Secondary | ICD-10-CM

## 2015-02-19 DIAGNOSIS — E785 Hyperlipidemia, unspecified: Secondary | ICD-10-CM | POA: Diagnosis not present

## 2015-02-19 LAB — LIPID PANEL
Cholesterol: 191 mg/dL (ref 0–200)
HDL: 31.2 mg/dL — ABNORMAL LOW
LDL Cholesterol: 123 mg/dL — ABNORMAL HIGH (ref 0–99)
NonHDL: 159.99
Total CHOL/HDL Ratio: 6
Triglycerides: 184 mg/dL — ABNORMAL HIGH (ref 0.0–149.0)
VLDL: 36.8 mg/dL (ref 0.0–40.0)

## 2015-02-19 LAB — COMPREHENSIVE METABOLIC PANEL
ALT: 35 U/L (ref 0–53)
AST: 29 U/L (ref 0–37)
Albumin: 4.6 g/dL (ref 3.5–5.2)
Alkaline Phosphatase: 72 U/L (ref 39–117)
BILIRUBIN TOTAL: 0.9 mg/dL (ref 0.2–1.2)
BUN: 15 mg/dL (ref 6–23)
CALCIUM: 9.5 mg/dL (ref 8.4–10.5)
CHLORIDE: 101 meq/L (ref 96–112)
CO2: 27 meq/L (ref 19–32)
Creatinine, Ser: 0.96 mg/dL (ref 0.40–1.50)
GFR: 89.88 mL/min (ref >60.00)
GLUCOSE: 179 mg/dL — AB (ref 70–99)
Potassium: 4 mEq/L (ref 3.5–5.1)
Sodium: 136 mEq/L (ref 135–145)
Total Protein: 7.5 g/dL (ref 6.0–8.3)

## 2015-02-19 LAB — CBC
HCT: 46.8 % (ref 39.0–52.0)
HEMOGLOBIN: 16.4 g/dL (ref 13.0–17.0)
MCHC: 34.9 g/dL (ref 30.0–36.0)
MCV: 85.6 fl (ref 78.0–100.0)
Platelets: 176 10*3/uL (ref 150.0–400.0)
RBC: 5.47 Mil/uL (ref 4.22–5.81)
RDW: 12.5 % (ref 11.5–15.5)
WBC: 6.9 10*3/uL (ref 4.0–10.5)

## 2015-02-19 LAB — HEMOGLOBIN A1C: HEMOGLOBIN A1C: 6.5 % (ref 4.6–6.5)

## 2015-02-19 MED ORDER — LOSARTAN POTASSIUM-HCTZ 100-25 MG PO TABS: 1.0000 | ORAL_TABLET | Freq: Every day | ORAL | Status: DC

## 2015-02-19 NOTE — Assessment & Plan Note (Signed)
Will repeat Lipid Profile today Encouraged him to consume a low fat diet Discussed starting a statin if LDL remains above 100

## 2015-02-19 NOTE — Assessment & Plan Note (Addendum)
Will check A1C today No microalbumin secondary to ARB therapy Foot exam today Advised him to get a flu shot in the fall 2016 He declines pneumovax and prevnar today Advised him to go ahead and make an appt for an eye exam Continue current medication regimen unless instructed otherwise

## 2015-02-19 NOTE — Progress Notes (Signed)
Pre visit review using our clinic review tool, if applicable. No additional management support is needed unless otherwise documented below in the visit note. 

## 2015-02-19 NOTE — Patient Instructions (Addendum)
Start taking a baby aspirin daily- 81 mg ECG today is normal We increased your BP medication to Hyzaar 100-25 mg daily Consume a low fat, low carb diet Health Maintenance A healthy lifestyle and preventative care can promote health and wellness.  Maintain regular health, dental, and eye exams.  Eat a healthy diet. Foods like vegetables, fruits, whole grains, low-fat dairy products, and lean protein foods contain the nutrients you need and are low in calories. Decrease your intake of foods high in solid fats, added sugars, and salt. Get information about a proper diet from your health care provider, if necessary.  Regular physical exercise is one of the most important things you can do for your health. Most adults should get at least 150 minutes of moderate-intensity exercise (any activity that increases your heart rate and causes you to sweat) each week. In addition, most adults need muscle-strengthening exercises on 2 or more days a week.   Maintain a healthy weight. The body mass index (BMI) is a screening tool to identify possible weight problems. It provides an estimate of body fat based on height and weight. Your health care provider can find your BMI and can help you achieve or maintain a healthy weight. For males 20 years and older:  A BMI below 18.5 is considered underweight.  A BMI of 18.5 to 24.9 is normal.  A BMI of 25 to 29.9 is considered overweight.  A BMI of 30 and above is considered obese.  Maintain normal blood lipids and cholesterol by exercising and minimizing your intake of saturated fat. Eat a balanced diet with plenty of fruits and vegetables. Blood tests for lipids and cholesterol should begin at age 26 and be repeated every 5 years. If your lipid or cholesterol levels are high, you are over age 74, or you are at high risk for heart disease, you may need your cholesterol levels checked more frequently.Ongoing high lipid and cholesterol levels should be treated with  medicines if diet and exercise are not working.  If you smoke, find out from your health care provider how to quit. If you do not use tobacco, do not start.  Lung cancer screening is recommended for adults aged 55-80 years who are at high risk for developing lung cancer because of a history of smoking. A yearly low-dose CT scan of the lungs is recommended for people who have at least a 30-pack-year history of smoking and are current smokers or have quit within the past 15 years. A pack year of smoking is smoking an average of 1 pack of cigarettes a day for 1 year (for example, a 30-pack-year history of smoking could mean smoking 1 pack a day for 30 years or 2 packs a day for 15 years). Yearly screening should continue until the smoker has stopped smoking for at least 15 years. Yearly screening should be stopped for people who develop a health problem that would prevent them from having lung cancer treatment.  If you choose to drink alcohol, do not have more than 2 drinks per day. One drink is considered to be 12 oz (360 mL) of beer, 5 oz (150 mL) of wine, or 1.5 oz (45 mL) of liquor.  Avoid the use of street drugs. Do not share needles with anyone. Ask for help if you need support or instructions about stopping the use of drugs.  High blood pressure causes heart disease and increases the risk of stroke. Blood pressure should be checked at least every 1-2 years. Ongoing  high blood pressure should be treated with medicines if weight loss and exercise are not effective.  If you are 49-33 years old, ask your health care provider if you should take aspirin to prevent heart disease.  Diabetes screening involves taking a blood sample to check your fasting blood sugar level. This should be done once every 3 years after age 59 if you are at a normal weight and without risk factors for diabetes. Testing should be considered at a younger age or be carried out more frequently if you are overweight and have at  least 1 risk factor for diabetes.  Colorectal cancer can be detected and often prevented. Most routine colorectal cancer screening begins at the age of 38 and continues through age 25. However, your health care provider may recommend screening at an earlier age if you have risk factors for colon cancer. On a yearly basis, your health care provider may provide home test kits to check for hidden blood in the stool. A small camera at the end of a tube may be used to directly examine the colon (sigmoidoscopy or colonoscopy) to detect the earliest forms of colorectal cancer. Talk to your health care provider about this at age 43 when routine screening begins. A direct exam of the colon should be repeated every 5-10 years through age 57, unless early forms of precancerous polyps or small growths are found.  People who are at an increased risk for hepatitis B should be screened for this virus. You are considered at high risk for hepatitis B if:  You were born in a country where hepatitis B occurs often. Talk with your health care provider about which countries are considered high risk.  Your parents were born in a high-risk country and you have not received a shot to protect against hepatitis B (hepatitis B vaccine).  You have HIV or AIDS.  You use needles to inject street drugs.  You live with, or have sex with, someone who has hepatitis B.  You are a man who has sex with other men (MSM).  You get hemodialysis treatment.  You take certain medicines for conditions like cancer, organ transplantation, and autoimmune conditions.  Hepatitis C blood testing is recommended for all people born from [birth year] through 1966 and any individual with known risk factors for hepatitis C.  Healthy men should no longer receive prostate-specific antigen (PSA) blood tests as part of routine cancer screening. Talk to your health care provider about prostate cancer screening.  Testicular cancer screening is not recommended  for adolescents or adult males who have no symptoms. Screening includes self-exam, a health care provider exam, and other screening tests. Consult with your health care provider about any symptoms you have or any concerns you have about testicular cancer.  Practice safe sex. Use condoms and avoid high-risk sexual practices to reduce the spread of sexually transmitted infections (STIs).  You should be screened for STIs, including gonorrhea and chlamydia if:  You are sexually active and are younger than 24 years.  You are older than 24 years, and your health care provider tells you that you are at risk for this type of infection.  Your sexual activity has changed since you were last screened, and you are at an increased risk for chlamydia or gonorrhea. Ask your health care provider if you are at risk.  If you are at risk of being infected with HIV, it is recommended that you take a prescription medicine daily to prevent HIV infection. This is  called pre-exposure prophylaxis (PrEP). You are considered at risk if:  You are a man who has sex with other men (MSM).  You are a heterosexual man who is sexually active with multiple partners.  You take drugs by injection.  You are sexually active with a partner who has HIV.  Talk with your health care provider about whether you are at high risk of being infected with HIV. If you choose to begin PrEP, you should first be tested for HIV. You should then be tested every 3 months for as long as you are taking PrEP.  Use sunscreen. Apply sunscreen liberally and repeatedly throughout the day. You should seek shade when your shadow is shorter than you. Protect yourself by wearing long sleeves, pants, a wide-brimmed hat, and sunglasses year round whenever you are outdoors.  Tell your health care provider of new moles or changes in moles, especially if there is a change in shape or color. Also, tell your health care provider if a mole is larger than the size  of a pencil eraser.  A one-time screening for abdominal aortic aneurysm (AAA) and surgical repair of large AAAs by ultrasound is recommended for men aged 62-75 years who are current or former smokers.  Stay current with your vaccines (immunizations). Document Released: 12/20/2007 Document Revised: 06/28/2013 Document Reviewed: 11/18/2010 [facility] Patient Information 2015 [place], [place]. This information is not intended to replace advice given to you by your health care provider. Make sure you discuss any questions you have with your health care provider.

## 2015-02-19 NOTE — Assessment & Plan Note (Signed)
Encouraged him to start a diet an exercise program

## 2015-02-19 NOTE — Assessment & Plan Note (Addendum)
We will increase Hyzaar to 100-25 mg daily Will check CBC and CMET today ECG today normal

## 2015-02-19 NOTE — Progress Notes (Signed)
Subjective:    Patient ID: Eric Sellers, male    DOB: 21-Jan-1971, 44 y.o.   MRN: [id number]  HPI  Pt presents to the clinic today for his annual exam.  Flu: never Tetanus: 2008 Pneumovax: never Prevnar: never Vision Screening: anually Dentist: biannually  Diet: He has been trying to cut back on fried and fatty foods. He has been trying to consume a low carb diet. Exercise: He gets on an elliptical or treadmill for at least 30 minutes a few days per week.  He is also due to follow up chronic conditions:  DM 2. His last A1C was 7%. He was advised to increase his Glipizide to BID in addition to his Metformin which he was already taking BID. He checks his blood sugar once a day. It ranges 55-163. He does not take flu or pneumonia vaccines. His last eye exam was 02/15/15.  HTN: BP fairly well controlled on Hyzaar. His BP today is 144/82. He reports he gets very anxious when he comes to the doctor. His BP at home ranges 128/83-150/83.  HLD: His last LDL was 102. He is not on a statin. He does try to consume a low fat diet.  Obesity: He has gained 10 lbs since his last visit. His BMI is 34.01.  Review of Systems      Past Medical History  Diagnosis Date  . Diabetes mellitus without complication   . Hypertension   . History of kidney stones     Current Outpatient Prescriptions  Medication Sig Dispense Refill  . azithromycin (ZITHROMAX) 250 MG tablet Take 2 tabs today, then 1 tab daily x 4 days 6 tablet 0  . glipiZIDE (GLUCOTROL) 10 MG tablet Take 1 tablet (10 mg total) by mouth 2 (two) times daily before a meal. 180 tablet 1  . glucose blood (ONE TOUCH TEST STRIPS) test strip One Touch Ultra 2 strips--250.00 50 each 5  . HYDROcodone-homatropine (HYCODAN) 5-1.5 MG/5ML syrup Take 5 mLs by mouth every 8 (eight) hours as needed for cough. 240 mL 0  . Lancets 30G MISC 1 each by Does not apply route daily. 250.00 100 each 2  . losartan-hydrochlorothiazide (HYZAAR) 50-12.5 MG per  tablet Take 1 tablet by mouth daily. 90 tablet 2  . metFORMIN (GLUCOPHAGE) 1000 MG tablet Take 1 tablet (1,000 mg total) by mouth 2 (two) times daily with a meal. 180 tablet 1  . Multiple Vitamins-Minerals (MULTIVITAMIN GUMMIES ADULT PO) Take 2 each by mouth daily.     No current facility-administered medications for this visit.    Allergies  Allergen Reactions  . Penicillins     Family History  Problem Relation Age of Onset  . Diabetes Mother   . Hypertension Mother   . Diabetes Father   . Hypertension Father   . Prostate cancer Father     Social History   Social History  . Marital Status: Single    Spouse Name: N/A  . Number of Children: N/A  . Years of Education: N/A   Occupational History  . Not on file.   Social History Main Topics  . Smoking status: Never Smoker   . Smokeless tobacco: Not on file  . Alcohol Use: 3.0 oz/week    5 Cans of beer per week     Comment: occasional  . Drug Use: No  . Sexual Activity: Yes   Other Topics Concern  . Not on file   Social History Narrative     Constitutional: Denies fever, malaise,  fatigue, headache or abrupt weight changes.  HEENT: Denies eye pain, eye redness, ear pain, ringing in the ears, wax buildup, runny nose, nasal congestion, bloody nose, or sore throat. Respiratory: Denies difficulty breathing, shortness of breath, cough or sputum production.   Cardiovascular: Denies chest pain, chest tightness, palpitations or swelling in the hands or feet.  Gastrointestinal: Denies abdominal pain, bloating, constipation, diarrhea or blood in the stool.  GU: Denies urgency, frequency, pain with urination, burning sensation, blood in urine, odor or discharge. Musculoskeletal: Denies decrease in range of motion, difficulty with gait, muscle pain or joint pain and swelling.  Skin: Denies redness, rashes, lesions or ulcercations.  Neurological: Denies dizziness, difficulty with memory, difficulty with speech or problems with  balance and coordination.  Psych: Pt reports anxiety. Denies depression, SI/HI.  No other specific complaints in a complete review of systems (except as listed in HPI above).  Objective:   Physical Exam   BP 144/82 mmHg  Pulse 102  Temp(Src) 98.3 F (36.8 C) (Oral)  Ht 5' 10.5" (1.791 m)  Wt 240 lb 8 oz (109.09 kg)  BMI 34.01 kg/m2  SpO2 98% Wt Readings from Last 3 Encounters:  02/19/15 240 lb 8 oz (109.09 kg)  09/19/14 230 lb (104.327 kg)  06/26/14 245 lb (111.131 kg)    General: Appears his stated age, obese in NAD. Skin: Warm, dry and intact. No rashes, lesions or ulcerations noted. HEENT: Head: normal shape and size; Eyes: sclera white, no icterus, conjunctiva pink, PERRLA and EOMs intact; Ears: Tm's gray and intact, normal light reflex; Nose: mucosa pink and moist, septum midline; Throat/Mouth: Teeth present, mucosa pink and moist, no exudate, lesions or ulcerations noted.  Neck: Neck supple, trachea midline. No masses, lumps or thyromegaly present.  Cardiovascular: Normal rate and rhythm. S1,S2 noted.  No murmur, rubs or gallops noted. No JVD or BLE edema. No carotid bruits noted. Pulmonary/Chest: Normal effort and positive vesicular breath sounds. No respiratory distress. No wheezes, rales or ronchi noted.  Abdomen: Soft and nontender. Normal bowel sounds, no bruits noted. No distention or masses noted. Liver, spleen and kidneys non palpable. Musculoskeletal: Normal range of motion. Strength 5/5 BUE/BLE. No difficulty with gait.  Neurological: Alert and oriented. Cranial nerves II-XII grossly intact. Coordination normal. Psychiatric: He does seem anxious today.   BMET    Component Value Date/Time   NA 138 09/19/2014 1651   K 4.2 09/19/2014 1651   CL 101 09/19/2014 1651   CO2 29 09/19/2014 1651   GLUCOSE 122* 09/19/2014 1651   BUN 13 09/19/2014 1651   CREATININE 0.90 09/19/2014 1651   CREATININE 0.88 08/31/2013 1630   CALCIUM 9.8 09/19/2014 1651    Lipid Panel       Component Value Date/Time   CHOL 170 09/19/2014 1651   TRIG 187.0* 09/19/2014 1651   HDL 30.80* 09/19/2014 1651   CHOLHDL 6 09/19/2014 1651   VLDL 37.4 09/19/2014 1651   LDLCALC 102* 09/19/2014 1651    CBC    Component Value Date/Time   WBC 8.1 09/19/2014 1651   RBC 5.39 09/19/2014 1651   HGB 15.7 09/19/2014 1651   HCT 45.1 09/19/2014 1651   PLT 245.0 09/19/2014 1651   MCV 83.6 09/19/2014 1651   MCH 30.0 08/31/2013 1630   MCHC 34.9 09/19/2014 1651   RDW 13.1 09/19/2014 1651   LYMPHSABS 1.9 08/31/2013 1630   MONOABS 0.8 08/31/2013 1630   EOSABS 0.1 08/31/2013 1630   BASOSABS 0.0 08/31/2013 1630    Hgb A1C Lab  Results  Component Value Date   HGBA1C 7.0* 09/19/2014        Assessment & Plan:   Preventative Health Maintenance:  Encouraged him to get a flu shot fall 2016 Encouraged him to continue seeing an eye doctor and dentist at least year Encouraged him to start and diet and exercise regimen Will check CBC, CMET, and Lipid Profile today  RTC in 6 months to follow up chronic conditions

## 2015-03-06 ENCOUNTER — Telehealth: Payer: Self-pay

## 2015-03-06 NOTE — Telephone Encounter (Signed)
Pt was started on hyzaar 100-25 on 02/19/15; since starting medication BP has been higher than before starting med. 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 No H/a, dizziness, CP or SOB. [facility]. Pt request cb.

## 2015-03-06 NOTE — Telephone Encounter (Signed)
He needs to come in to let me check it

## 2015-03-07 ENCOUNTER — Encounter: Payer: Self-pay | Admitting: Primary Care

## 2015-03-07 ENCOUNTER — Ambulatory Visit (INDEPENDENT_AMBULATORY_CARE_PROVIDER_SITE_OTHER): Payer: BLUE CROSS/BLUE SHIELD | Admitting: Primary Care

## 2015-03-07 VITALS — BP 136/84 | HR 88 | Temp 97.8°F | Ht 70.5 in | Wt 237.8 lb

## 2015-03-07 DIAGNOSIS — I10 Essential (primary) hypertension: Secondary | ICD-10-CM

## 2015-03-07 MED ORDER — AMLODIPINE BESYLATE 5 MG PO TABS: 5.0000 mg | ORAL_TABLET | Freq: Every day | ORAL | Status: DC

## 2015-03-07 NOTE — Telephone Encounter (Signed)
Pt has appt with Jae Dire at 3:45

## 2015-03-07 NOTE — Assessment & Plan Note (Signed)
Hyzaar increased on 02/19/15. BP remains elevated per home readings. Manual reading in clinic today 136/84, 140/90, and on automatic machine 130/92. Due to clinic elevations and home readings will add in low dose Amlodipine. He is to follow up in 2 weeks with PCP or myself for re-check. Specific instructions provided to patient regarding when and how to check his BP. He verbalized understanding.

## 2015-03-07 NOTE — Progress Notes (Signed)
Subjective:    Patient ID: Eric Sellers, male    DOB: 08-21-1970, 44 y.o.   MRN: [id number]  HPI  Eric Sellers is a 44 year old male who presents today due to elevation of blood pressure. He was evaluated on 02/19/15 for hypertension and Hyzaar was increased to 100-25mg . 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   His home readings on an automatic machine have been 144/94 and 147/94 today. He checked his blood pressure just now in the clinic on his automatic machine and got 141/102 which was higher than our manual reading of 136/84. Our automatic reading was 136/90. Denies headaches, dizziness, chest pain, changes in vision. He is worried that his blood pressure will prevent him from passing his DOT physical.   Review of Systems  Eyes: Negative for visual disturbance.  Respiratory: Negative for shortness of breath.   Cardiovascular: Negative for chest pain and leg swelling.  Neurological: Negative for dizziness and light-headedness.       Past Medical History  Diagnosis Date  . Diabetes mellitus without complication   . Hypertension   . History of kidney stones     Social History   Social History  . Marital Status: Single    Spouse Name: N/A  . Number of Children: N/A  . Years of Education: N/A   Occupational History  . Not on file.   Social History Main Topics  . Smoking status: Never Smoker   . Smokeless tobacco: Never Used  . Alcohol Use: 3.0 oz/week    5 Cans of beer per week     Comment: occasional  . Drug Use: No  . Sexual Activity: Yes   Other Topics Concern  . Not on file   Social History Narrative    Past Surgical History  Procedure Laterality Date  . Tonsillectomy      Family History  Problem Relation Age of Onset  . Diabetes Mother   . Hypertension Mother   . Diabetes Father   . Hypertension Father   . Prostate cancer Father     Allergies  Allergen Reactions  .  Penicillins     Current Outpatient Prescriptions on File Prior to Visit  Medication Sig Dispense Refill  . glipiZIDE (GLUCOTROL) 10 MG tablet Take 1 tablet (10 mg total) by mouth 2 (two) times daily before a meal. 180 tablet 1  . glucose blood (ONE TOUCH TEST STRIPS) test strip One Touch Ultra 2 strips--250.00 (Patient taking differently: 1 each by Other route 2 (two) times daily. ) 50 each 5  . Lancets 30G MISC 1 each by Does not apply route daily. 250.00 100 each 2  . losartan-hydrochlorothiazide (HYZAAR) 100-25 MG per tablet Take 1 tablet by mouth daily. 30 tablet 5  . metFORMIN (GLUCOPHAGE) 1000 MG tablet Take 1 tablet (1,000 mg total) by mouth 2 (two) times daily with a meal. 180 tablet 1  . Multiple Vitamins-Minerals (MULTIVITAMIN GUMMIES ADULT PO) Take 2 each by mouth daily.     No current facility-administered medications on file prior to visit.    BP 136/84 mmHg  Pulse 88  Temp(Src) 97.8 F (36.6 C) (Oral)  Ht 5' 10.5" (1.791 m)  Wt 237 lb 12.8 oz (107.865 kg)  BMI 33.63 kg/m2  SpO2 98%    Objective:   Physical Exam  Constitutional: He appears well-nourished.  Cardiovascular: Normal rate and regular rhythm.   Pulmonary/Chest: Effort normal and breath sounds  normal.  Skin: Skin is warm and dry.  Psychiatric: He has a normal mood and affect.          Assessment & Plan:

## 2015-03-07 NOTE — Patient Instructions (Addendum)
Schedule a lab only appointment in 3 months for cholesterol check.  Continue to take your blood pressure medication.  Check your blood pressure once daily around the same time everyday. Make sure that you've been resting for at least 20 minutes.   Start Amlodipine for elevated blood pressure. Take 1 tablet by mouth daily.  Please notify your PCP if you start experiencing headaches, dizziness, chest pain.  Follow up with your PCP or myself in 2 weeks for re-check of your blood pressure.  It was a pleasure meeting you!

## 2015-03-07 NOTE — Progress Notes (Signed)
Pre visit review using our clinic review tool, if applicable. No additional management support is needed unless otherwise documented below in the visit note. 

## 2015-04-06 ENCOUNTER — Encounter: Payer: Self-pay | Admitting: Primary Care

## 2015-04-06 ENCOUNTER — Ambulatory Visit (INDEPENDENT_AMBULATORY_CARE_PROVIDER_SITE_OTHER): Payer: BLUE CROSS/BLUE SHIELD | Admitting: Primary Care

## 2015-04-06 VITALS — BP 138/88 | HR 69 | Temp 97.4°F | Ht 71.0 in | Wt 241.1 lb

## 2015-04-06 DIAGNOSIS — I10 Essential (primary) hypertension: Secondary | ICD-10-CM | POA: Diagnosis not present

## 2015-04-06 MED ORDER — AMLODIPINE BESYLATE 5 MG PO TABS: 5.0000 mg | ORAL_TABLET | Freq: Every day | ORAL | Status: DC

## 2015-04-06 NOTE — Progress Notes (Signed)
Pre visit review using our clinic review tool, if applicable. No additional management support is needed unless otherwise documented below in the visit note. 

## 2015-04-06 NOTE — Progress Notes (Signed)
Subjective:    Patient ID: Eric Sellers, male    DOB: 03-Aug-1970, 44 y.o.   MRN: [id number]  HPI  Eric Sellers is a 44 year old male who presents today for follow up of hypertension. Currently managed on Hyzaar. He was noted to have an elevated reading on 02/19/2015 and endorsesed home readings of 120's-150's/80's. His Hyzaar was increased to 100-25 daily during that visit. He was evaluated again on 03/07/15 for continued elevation in home readings. His BP last visit was stable, but he was concerned due to home readings of 130's-140's/90's and upcoming DOT physical. He was initiated on amlodipine 5 mg last visit.  Since his last visit he's been getting readings of 120's-130's/80's. He denies chest pain, headaches, shortness of breath.    BP Readings from Last 3 Encounters:  04/06/15 138/88  03/07/15 136/84  02/19/15 138/82     Review of Systems  Respiratory: Negative for shortness of breath.   Cardiovascular: Negative for chest pain.  Neurological: Negative for dizziness and headaches.       Past Medical History  Diagnosis Date  . Diabetes mellitus without complication   . Hypertension   . History of kidney stones     Social History   Social History  . Marital Status: Single    Spouse Name: N/A  . Number of Children: N/A  . Years of Education: N/A   Occupational History  . Not on file.   Social History Main Topics  . Smoking status: Never Smoker   . Smokeless tobacco: Never Used  . Alcohol Use: 3.0 oz/week    5 Cans of beer per week     Comment: occasional  . Drug Use: No  . Sexual Activity: Yes   Other Topics Concern  . Not on file   Social History Narrative    Past Surgical History  Procedure Laterality Date  . Tonsillectomy      Family History  Problem Relation Age of Onset  . Diabetes Mother   . Hypertension Mother   . Diabetes Father   . Hypertension Father   . Prostate cancer Father     Allergies  Allergen Reactions  . Penicillins       Current Outpatient Prescriptions on File Prior to Visit  Medication Sig Dispense Refill  . glipiZIDE (GLUCOTROL) 10 MG tablet Take 1 tablet (10 mg total) by mouth 2 (two) times daily before a meal. 180 tablet 1  . glucose blood (ONE TOUCH TEST STRIPS) test strip One Touch Ultra 2 strips--250.00 (Patient taking differently: 1 each by Other route 2 (two) times daily. ) 50 each 5  . Lancets 30G MISC 1 each by Does not apply route daily. 250.00 100 each 2  . losartan-hydrochlorothiazide (HYZAAR) 100-25 MG per tablet Take 1 tablet by mouth daily. 30 tablet 5  . metFORMIN (GLUCOPHAGE) 1000 MG tablet Take 1 tablet (1,000 mg total) by mouth 2 (two) times daily with a meal. 180 tablet 1  . Multiple Vitamins-Minerals (MULTIVITAMIN GUMMIES ADULT PO) Take 2 each by mouth daily.     No current facility-administered medications on file prior to visit.    BP 138/88 mmHg  Pulse 69  Temp(Src) 97.4 F (36.3 C) (Oral)  Ht  (1.803 m)  Wt 241 lb 1.9 oz (109.371 kg)  BMI 33.64 kg/m2  SpO2 97%    Objective:   Physical Exam  Constitutional: He appears well-nourished.  Cardiovascular: Normal rate and regular rhythm.   Pulmonary/Chest: Effort normal and breath sounds  normal.  Skin: Skin is warm and dry.          Assessment & Plan:

## 2015-04-06 NOTE — Patient Instructions (Addendum)
Continue Hyzaar and Amlodipine medications for your blood pressure.  Work to lose weight through [employer] and exercise.  Follow up with Rene Kocher as discussed with her.  It was a pleasure to see you today!

## 2015-04-06 NOTE — Assessment & Plan Note (Signed)
Stable in office today. Managed on Amlodipine 5 mg and Hyzaar 100-25 mg. Denies dizziness, chest pain. Continue current regimen. Follow up with PCP as planned.

## 2015-04-17 ENCOUNTER — Other Ambulatory Visit: Payer: Self-pay | Admitting: Internal Medicine

## 2015-06-16 ENCOUNTER — Other Ambulatory Visit: Payer: Self-pay | Admitting: Internal Medicine

## 2015-08-09 ENCOUNTER — Ambulatory Visit (INDEPENDENT_AMBULATORY_CARE_PROVIDER_SITE_OTHER): Payer: BLUE CROSS/BLUE SHIELD | Admitting: Internal Medicine

## 2015-08-09 ENCOUNTER — Encounter: Payer: Self-pay | Admitting: Internal Medicine

## 2015-08-09 VITALS — BP 128/88 | HR 110 | Temp 98.8°F | Wt 241.0 lb

## 2015-08-09 DIAGNOSIS — E785 Hyperlipidemia, unspecified: Secondary | ICD-10-CM | POA: Diagnosis not present

## 2015-08-09 DIAGNOSIS — M7702 Medial epicondylitis, left elbow: Secondary | ICD-10-CM

## 2015-08-09 DIAGNOSIS — E119 Type 2 diabetes mellitus without complications: Secondary | ICD-10-CM

## 2015-08-09 DIAGNOSIS — I10 Essential (primary) hypertension: Secondary | ICD-10-CM

## 2015-08-09 LAB — CBC
HCT: 48.8 % (ref 39.0–52.0)
HEMOGLOBIN: 17 g/dL (ref 13.0–17.0)
MCHC: 34.8 g/dL (ref 30.0–36.0)
MCV: 85.2 fl (ref 78.0–100.0)
Platelets: 201 10*3/uL (ref 150.0–400.0)
RBC: 5.73 Mil/uL (ref 4.22–5.81)
RDW: 12.7 % (ref 11.5–15.5)
WBC: 7.6 10*3/uL (ref 4.0–10.5)

## 2015-08-09 LAB — COMPREHENSIVE METABOLIC PANEL
ALT: 49 U/L (ref 0–53)
AST: 35 U/L (ref 0–37)
Albumin: 4.6 g/dL (ref 3.5–5.2)
Alkaline Phosphatase: 74 U/L (ref 39–117)
BILIRUBIN TOTAL: 1.1 mg/dL (ref 0.2–1.2)
BUN: 17 mg/dL (ref 6–23)
CO2: 26 mEq/L (ref 19–32)
Calcium: 10 mg/dL (ref 8.4–10.5)
Chloride: 102 mEq/L (ref 96–112)
Creatinine, Ser: 0.98 mg/dL (ref 0.40–1.50)
GFR: 87.59 mL/min (ref >60.00)
GLUCOSE: 174 mg/dL — AB (ref 70–99)
Potassium: 4 mEq/L (ref 3.5–5.1)
Sodium: 138 mEq/L (ref 135–145)
Total Protein: 7.7 g/dL (ref 6.0–8.3)

## 2015-08-09 LAB — LIPID PANEL
Cholesterol: 193 mg/dL (ref 0–200)
HDL: 31.6 mg/dL — AB (ref >39.00)
NONHDL: 161.44
Total CHOL/HDL Ratio: 6
Triglycerides: 239 mg/dL — ABNORMAL HIGH (ref 0.0–149.0)
VLDL: 47.8 mg/dL — ABNORMAL HIGH (ref 0.0–40.0)

## 2015-08-09 LAB — HEMOGLOBIN A1C: HEMOGLOBIN A1C: 7.1 % — AB (ref 4.6–6.5)

## 2015-08-09 LAB — LDL CHOLESTEROL, DIRECT: Direct LDL: 121 mg/dL

## 2015-08-09 LAB — MICROALBUMIN / CREATININE URINE RATIO
Creatinine,U: 102.2 mg/dL
Microalb Creat Ratio: 0.7 mg/g (ref 0.0–30.0)
Microalb, Ur: <0.7 mg/dL (ref 0.0–1.9)

## 2015-08-09 NOTE — Progress Notes (Signed)
Pre visit review using our clinic review tool, if applicable. No additional management support is needed unless otherwise documented below in the visit note. 

## 2015-08-09 NOTE — Patient Instructions (Signed)

## 2015-08-09 NOTE — Progress Notes (Signed)
Subjective:    Patient ID: Eric Sellers, male    DOB: 04/17/1971, 45 y.o.   MRN: [id number]  HPI  Pt is a 45 year old male who presents today for follow up of his chronic conditions.  DM2: His last A1C was 6.5%. He is currently taking Glipizide and Metformin BID. He checks his blood sugar once or twice a day which ranges from 80-120s. He does not take flu or pneumonia vaccines. His last eye exam was 02/15/15. Denies pain/numbness/tingling in extremities, hypoglycemia episodes, polyuria, polydipsia or blurry vision.  HTN:  BP currently managed on Hyzaar and Amlodipine. His blood pressure today is 128/88. EKG from 02/19/2015 reviewed. He denies chest pain, chest tightness, SOB or palpitations.  HLD: His last LDL was 123. He is not on a statin. He states he has not been eating a low fat or low cholesterol diet.  Obesity: He had gained 10 lbs at his visit 6 months ago and his weight is up 3 lbs from his last visit. He has not been working on exercise or eating a low fat diet.   Pt also notes pain in his left elbow. Pain over the medial epicondyle. Increased pain with pulling motion. 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 He denies any injury to the area. He has not taken anything OTC.  Review of Systems  Past Medical History  Diagnosis Date  . Diabetes mellitus without complication (HCC)   . Hypertension   . History of kidney stones     Current Outpatient Prescriptions  Medication Sig Dispense Refill  . amLODipine (NORVASC) 5 MG tablet Take 1 tablet (5 mg total) by mouth daily. 30 tablet 5  . glipiZIDE (GLUCOTROL) 10 MG tablet TAKE 1 TABLET (10 MG TOTAL) BY MOUTH 2 TIMES DAILY BEFORE A MEAL. 180 tablet 1  . glucose blood (ONE TOUCH TEST STRIPS) test strip One Touch Ultra 2 strips--250.00 (Patient taking differently: 1 each by Other route 2 (two) times daily. ) 50 each 5  . Lancets 30G MISC 1 each by Does not apply route daily.  250.00 100 each 2  . losartan-hydrochlorothiazide (HYZAAR) 100-25 MG per tablet Take 1 tablet by mouth daily. 30 tablet 5  . metFORMIN (GLUCOPHAGE) 1000 MG tablet Take 1 tablet (1,000 mg total) by mouth 2 (two) times daily with a meal. SCHEDULE FOLLOW UP VISIT FOR February 2017 180 tablet 0  . Multiple Vitamins-Minerals (MULTIVITAMIN GUMMIES ADULT PO) Take 2 each by mouth daily.     No current facility-administered medications for this visit.    Allergies  Allergen Reactions  . Penicillins     Family History  Problem Relation Age of Onset  . Diabetes Mother   . Hypertension Mother   . Diabetes Father   . Hypertension Father   . Prostate cancer Father     Social History   Social History  . Marital Status: Single    Spouse Name: N/A  . Number of Children: N/A  . Years of Education: N/A   Occupational History  . Not on file.   Social History Main Topics  . Smoking status: Never Smoker   . Smokeless tobacco: Never Used  . Alcohol Use: 3.0 oz/week    5 Cans of beer per week     Comment: occasional  . Drug Use: No  . Sexual Activity: Yes   Other Topics Concern  . Not on file   Social History Narrative  Constitutional: Denies fatigue, headache or abrupt weight changes.  HEENT: Denies blurry vision. Respiratory: Denies difficulty breathing or shortness of breath. Cardiovascular: Denies chest pain, chest tightness, palpitations or swelling in the hands or feet.  GU: Denies polyuria. GI: Denies polydipsia.  Musculoskeletal: Positive for medial epicondyle pain. Denies decrease in range of motion. Skin: Denies redness, rashes, lesions or ulcercations.  Neurological: Denies dizziness.   No other specific complaints in a complete review of systems (except as listed in HPI above).     Objective:   Physical Exam  BP 128/88 mmHg  Pulse 110  Temp(Src) 98.8 F (37.1 C) (Oral)  Wt 241 lb (109.317 kg)  SpO2 98% Wt Readings from Last 3 Encounters:  08/09/15 241  lb (109.317 kg)  04/06/15 241 lb 1.9 oz (109.371 kg)  03/07/15 237 lb 12.8 oz (107.865 kg)    General: Appears his stated age, obese in NAD. Skin: Warm, dry and intact. No rashes, lesions or ulcerations noted. HEENT: Head: normal shape and size. PERRLA. Cardiovascular: Normal rate and rhythm. S1,S2 noted.  No murmur, rubs or gallops noted. No BLE edema. No carotid bruits noted. Pulmonary/Chest: Normal effort and positive vesicular breath sounds. No respiratory distress. No wheezes, rales or ronchi noted.  Musculoskeletal: Medial epicondyle tender to palpation. No joint swelling. Normal range of motion. 5/5 strength bilateral upper extremity. Neuro: Sensation intact to BLE.  BMET    Component Value Date/Time   NA 136 02/19/2015 0900   K 4.0 02/19/2015 0900   CL 101 02/19/2015 0900   CO2 27 02/19/2015 0900   GLUCOSE 179* 02/19/2015 0900   BUN 15 02/19/2015 0900   CREATININE 0.96 02/19/2015 0900   CREATININE 0.88 08/31/2013 1630   CALCIUM 9.5 02/19/2015 0900    Lipid Panel     Component Value Date/Time   CHOL 191 02/19/2015 0900   TRIG 184.0* 02/19/2015 0900   HDL 31.20* 02/19/2015 0900   CHOLHDL 6 02/19/2015 0900   VLDL 36.8 02/19/2015 0900   LDLCALC 123* 02/19/2015 0900    CBC    Component Value Date/Time   WBC 6.9 02/19/2015 0900   RBC 5.47 02/19/2015 0900   HGB 16.4 02/19/2015 0900   HCT 46.8 02/19/2015 0900   PLT 176.0 02/19/2015 0900   MCV 85.6 02/19/2015 0900   MCH 30.0 08/31/2013 1630   MCHC 34.9 02/19/2015 0900   RDW 12.5 02/19/2015 0900   LYMPHSABS 1.9 08/31/2013 1630   MONOABS 0.8 08/31/2013 1630   EOSABS 0.1 08/31/2013 1630   BASOSABS 0.0 08/31/2013 1630    Hgb A1C Lab Results  Component Value Date   HGBA1C 6.5 02/19/2015         Assessment & Plan:  DM2:  Continue Metformin BID and Glipizide BID CBC, CMET, A1C and Microalbumin today  HTN:  Continue Hyzaar and Amlodipine as described Continue monitoring BP at home  HLD:  Lipid  panel today, will start on low dose statin if LDL not at goal Encouraged a low fat diet Encouraged him to start a baby ASA  Medial Epicondylitis:  Instructed to take OTC Ibuprofen or Aleve PRN Can wear a golfers elbow brace when performing aggravating activities  RTC in 3 months if started on statin and in 6 months otherwise.

## 2015-09-15 ENCOUNTER — Other Ambulatory Visit: Payer: Self-pay | Admitting: Internal Medicine

## 2015-09-29 ENCOUNTER — Other Ambulatory Visit: Payer: Self-pay | Admitting: Internal Medicine

## 2015-09-29 ENCOUNTER — Other Ambulatory Visit: Payer: Self-pay | Admitting: Primary Care

## 2016-01-05 LAB — HM DIABETES EYE EXAM

## 2016-01-15 ENCOUNTER — Other Ambulatory Visit: Payer: Self-pay | Admitting: Internal Medicine

## 2016-02-12 ENCOUNTER — Ambulatory Visit (INDEPENDENT_AMBULATORY_CARE_PROVIDER_SITE_OTHER): Payer: BLUE CROSS/BLUE SHIELD | Admitting: Internal Medicine

## 2016-02-12 ENCOUNTER — Encounter: Payer: Self-pay | Admitting: Internal Medicine

## 2016-02-12 VITALS — BP 124/82 | HR 94 | Temp 98.1°F | Wt 238.8 lb

## 2016-02-12 DIAGNOSIS — I10 Essential (primary) hypertension: Secondary | ICD-10-CM | POA: Diagnosis not present

## 2016-02-12 DIAGNOSIS — E669 Obesity, unspecified: Secondary | ICD-10-CM | POA: Diagnosis not present

## 2016-02-12 DIAGNOSIS — E119 Type 2 diabetes mellitus without complications: Secondary | ICD-10-CM

## 2016-02-12 DIAGNOSIS — E785 Hyperlipidemia, unspecified: Secondary | ICD-10-CM

## 2016-02-12 LAB — COMPREHENSIVE METABOLIC PANEL
ALBUMIN: 4.6 g/dL (ref 3.5–5.2)
ALK PHOS: 66 U/L (ref 39–117)
ALT: 68 U/L — AB (ref 0–53)
AST: 48 U/L — ABNORMAL HIGH (ref 0–37)
BILIRUBIN TOTAL: 1 mg/dL (ref 0.2–1.2)
BUN: 14 mg/dL (ref 6–23)
CO2: 29 mEq/L (ref 19–32)
Calcium: 9.8 mg/dL (ref 8.4–10.5)
Chloride: 100 mEq/L (ref 96–112)
Creatinine, Ser: 0.94 mg/dL (ref 0.40–1.50)
GFR: 91.7 mL/min (ref >60.00)
GLUCOSE: 155 mg/dL — AB (ref 70–99)
Potassium: 3.9 mEq/L (ref 3.5–5.1)
Sodium: 138 mEq/L (ref 135–145)
TOTAL PROTEIN: 7.4 g/dL (ref 6.0–8.3)

## 2016-02-12 LAB — LIPID PANEL
CHOLESTEROL: 209 mg/dL — AB (ref 0–200)
HDL: 31.6 mg/dL — AB (ref >39.00)
LDL Cholesterol: 141 mg/dL — ABNORMAL HIGH (ref 0–99)
NONHDL: 177.29
Total CHOL/HDL Ratio: 7
Triglycerides: 181 mg/dL — ABNORMAL HIGH (ref 0.0–149.0)
VLDL: 36.2 mg/dL (ref 0.0–40.0)

## 2016-02-12 LAB — HEMOGLOBIN A1C: HEMOGLOBIN A1C: 7.1 % — AB (ref 4.6–6.5)

## 2016-02-12 NOTE — Assessment & Plan Note (Signed)
Will check Lipid Profile and CMET today Encouraged him to consume a low fat diet Continue Fish Oil and baby ASA daily

## 2016-02-12 NOTE — Patient Instructions (Signed)

## 2016-02-12 NOTE — Assessment & Plan Note (Signed)
Encouraged him to consume a low fat, low carb diet and exercise to lose weight A1C and Lipid profile today No microalbumin secondary to ARB therapy Encouraged yearly eye exams He declines flu and pneumovax Will refill testing supplies, continue testing BID Foot exam today Continue Metformin and Glipizide unless directed otherwise

## 2016-02-12 NOTE — Assessment & Plan Note (Signed)
Controlled on Hyzaar and Amlodipine CMET today

## 2016-02-12 NOTE — Assessment & Plan Note (Signed)
Encouraged him to continue to work on diet and exercise

## 2016-02-12 NOTE — Progress Notes (Signed)
Subjective:    Patient ID: Eric Sellers, male    DOB: 03-25-1971, 45 y.o.   MRN: [id number]  HPI  Pt presents to the clinic today for follow up of his chronic conditions.  DM2: His last A1C was 7.1%. Microalbumin was negative. He is currently taking Glipizide and Metformin BID. He checks his blood sugar once or twice a day which ranges from 105-141s. He does not take flu or pneumonia vaccines. His last eye exam was 01/2016. He denies pain/numbness/tingling in extremities, hypoglycemia episodes, polyuria, polydipsia or blurry vision.  HTN:  BP currently managed on Hyzaar and Amlodipine. His blood pressure today is 124/82. EKG from 02/19/2015 reviewed. He denies chest pain, chest tightness, SOB or palpitations.  HLD: His last LDL was 121. He is not on a statin but he was advised to consume a low fat diet and start taking Fish Oil once daily. He states he has been working on decreasing fried foods.  Obesity: He had lost 4 lbs since his last visit. He is reducing carbs, cutting back on beer and joined an [employer], exercising for at least 30 minutes 4 days per week.  Review of Systems   Past Medical History:  Diagnosis Date  . Diabetes mellitus without complication (HCC)   . History of kidney stones   . Hypertension     Current Outpatient Prescriptions  Medication Sig Dispense Refill  . amLODipine (NORVASC) 5 MG tablet TAKE 1 TABLET BY MOUTH DAILY. 30 tablet 4  . glipiZIDE (GLUCOTROL) 10 MG tablet Take 1 tablet (10 mg total) by mouth 2 (two) times daily before a meal. PLEASE SCHEDULE ANNUAL PHYSICAL 180 tablet 0  . glucose blood (ONE TOUCH TEST STRIPS) test strip One Touch Ultra 2 strips--250.00 (Patient taking differently: 1 each by Other route 2 (two) times daily. ) 50 each 5  . Lancets 30G MISC 1 each by Does not apply route daily. 250.00 100 each 2  . losartan-hydrochlorothiazide (HYZAAR) 100-25 MG tablet TAKE 1 TABLET BY MOUTH DAILY. 30 tablet 4  . metFORMIN (GLUCOPHAGE)  1000 MG tablet Take 1 tablet (1,000 mg total) by mouth 2 (two) times daily with a meal. SCHEDULE FOLLOW UP FOR August 2017 180 tablet 1  . Multiple Vitamins-Minerals (MULTIVITAMIN GUMMIES ADULT PO) Take 2 each by mouth daily.     No current facility-administered medications for this visit.     Allergies  Allergen Reactions  . Penicillins     Family History  Problem Relation Age of Onset  . Diabetes Mother   . Hypertension Mother   . Diabetes Father   . Hypertension Father   . Prostate cancer Father     Social History   Social History  . Marital status: Single    Spouse name: N/A  . Number of children: N/A  . Years of education: N/A   Occupational History  . Not on file.   Social History Main Topics  . Smoking status: Never Smoker  . Smokeless tobacco: Never Used  . Alcohol use 3.0 oz/week    5 Cans of beer per week     Comment: occasional  . Drug use: No  . Sexual activity: Yes   Other Topics Concern  . Not on file   Social History Narrative  . No narrative on file     Constitutional: Denies fever, malaise, fatigue, headache or abrupt weight changes.  HEENT: Denies eye pain, eye redness, ear pain, ringing in the ears, wax buildup, runny nose, nasal congestion,  bloody nose, or sore throat. Respiratory: Denies difficulty breathing, shortness of breath, cough or sputum production.   Cardiovascular: Denies chest pain, chest tightness, palpitations or swelling in the hands or feet.  Skin: Denies redness, rashes, lesions or ulcercations.  Neurological: Denies dizziness, difficulty with memory, difficulty with speech or problems with balance and coordination.  Psych: Denies anxiety, depression, SI/HI.  No other specific complaints in a complete review of systems (except as listed in HPI above).     Objective:   Physical Exam  BP 124/82   Pulse 94   Temp 98.1 F (36.7 C) (Oral)   Wt 238 lb 12 oz (108.3 kg)   SpO2 98%   BMI 33.30 kg/m   Wt Readings from  Last 3 Encounters:  08/09/15 241 lb (109.3 kg)  04/06/15 241 lb 1.9 oz (109.4 kg)  03/07/15 237 lb 12.8 oz (107.9 kg)    General: Appears his stated age, obese in NAD. Skin: Warm, dry and intact. No rashes, lesions or ulcerations noted. HEENT: Head: normal shape and size. PERRLA. Cardiovascular: Normal rate and rhythm. S1,S2 noted.  No murmur, rubs or gallops noted. No BLE edema. No carotid bruits noted. Radial and pedal pulses 2+. Pulmonary/Chest: Normal effort and positive vesicular breath sounds. No respiratory distress. No wheezes, rales or ronchi noted.  Neuro: Sensation intact to BLE.  BMET    Component Value Date/Time   NA 138 08/09/2015 0926   K 4.0 08/09/2015 0926   CL 102 08/09/2015 0926   CO2 26 08/09/2015 0926   GLUCOSE 174 (H) 08/09/2015 0926   BUN 17 08/09/2015 0926   CREATININE 0.98 08/09/2015 0926   CREATININE 0.88 08/31/2013 1630   CALCIUM 10.0 08/09/2015 0926    Lipid Panel     Component Value Date/Time   CHOL 193 08/09/2015 0926   TRIG 239.0 (H) 08/09/2015 0926   HDL 31.60 (L) 08/09/2015 0926   CHOLHDL 6 08/09/2015 0926   VLDL 47.8 (H) 08/09/2015 0926   LDLCALC 123 (H) 02/19/2015 0900    CBC    Component Value Date/Time   WBC 7.6 08/09/2015 0926   RBC 5.73 08/09/2015 0926   HGB 17.0 08/09/2015 0926   HCT 48.8 08/09/2015 0926   PLT 201.0 08/09/2015 0926   MCV 85.2 08/09/2015 0926   MCH 30.0 08/31/2013 1630   MCHC 34.8 08/09/2015 0926   RDW 12.7 08/09/2015 0926   LYMPHSABS 1.9 08/31/2013 1630   MONOABS 0.8 08/31/2013 1630   EOSABS 0.1 08/31/2013 1630   BASOSABS 0.0 08/31/2013 1630    Hgb A1C Lab Results  Component Value Date   HGBA1C 7.1 (H) 08/09/2015         Assessment & Plan:

## 2016-02-14 MED ORDER — LOSARTAN POTASSIUM-HCTZ 100-25 MG PO TABS: 1.0000 | ORAL_TABLET | Freq: Every day | ORAL | 1 refills | Status: DC

## 2016-02-14 MED ORDER — GLIPIZIDE 10 MG PO TABS: 10.0000 mg | ORAL_TABLET | Freq: Two times a day (BID) | ORAL | 1 refills | Status: DC

## 2016-02-14 MED ORDER — AMLODIPINE BESYLATE 5 MG PO TABS: 5.0000 mg | ORAL_TABLET | Freq: Every day | ORAL | 1 refills | Status: DC

## 2016-02-14 MED ORDER — METFORMIN HCL 1000 MG PO TABS: 1000.0000 mg | ORAL_TABLET | Freq: Two times a day (BID) | ORAL | 1 refills | Status: DC

## 2016-02-14 MED ORDER — SITAGLIPTIN PHOSPHATE 50 MG PO TABS: 50.0000 mg | ORAL_TABLET | Freq: Every day | ORAL | 2 refills | Status: DC

## 2016-02-14 NOTE — Addendum Note (Signed)
Addended by: Roena MaladyEVONTENNO, Tam Savoia Y on: 02/14/2016 04:56 PM   Modules accepted: Orders

## 2016-02-15 ENCOUNTER — Telehealth: Payer: Self-pay

## 2016-02-15 NOTE — Telephone Encounter (Signed)
Pt left v/m; Januvia is too expensive and pt request less expensive substitute; advised pt to ck with his ins co to get names of meds that are more affordable for pt that could be substituted for Januvia. Pt voiced understanding and will cb with info. Pt wants note sent to Pamala Hurry Baity NP as FYI.

## 2016-02-15 NOTE — Telephone Encounter (Signed)
Noted, will wait to see what insurance covers

## 2016-02-23 ENCOUNTER — Other Ambulatory Visit: Payer: Self-pay | Admitting: Internal Medicine

## 2016-04-15 ENCOUNTER — Other Ambulatory Visit: Payer: Self-pay | Admitting: Internal Medicine

## 2016-04-16 ENCOUNTER — Telehealth: Payer: Self-pay

## 2016-04-16 NOTE — Telephone Encounter (Signed)
V/M left requesting status of being able to get Januvia. Pt has 2 pills left.

## 2016-04-16 NOTE — Telephone Encounter (Signed)
Received refill request for Januvia---last telephone note was a call from pt stating that medication was too expensive---Regina stated she would wait for insurance to let us know what is covered... I do not recall ever receiving correspondence or a PA---I have submitted a PA through covermymeds for [employer]---awaiting response

## 2016-04-17 ENCOUNTER — Telehealth: Payer: Self-pay | Admitting: Internal Medicine

## 2016-04-17 MED ORDER — SITAGLIPTIN PHOSPHATE 50 MG PO TABS
50.0000 mg | ORAL_TABLET | Freq: Every day | ORAL | 2 refills | Status: DC
Start: 2016-04-17 — End: 2016-07-14

## 2016-04-17 NOTE — Telephone Encounter (Signed)
Returned your call bestnumber [phone]

## 2016-04-17 NOTE — Telephone Encounter (Signed)
Ok to refill Januvia

## 2016-04-17 NOTE — Telephone Encounter (Signed)
Received a message requesting a refill on the pts jenuvia.  He is on his last pill today.  Can you please refil this for him.

## 2016-04-17 NOTE — Telephone Encounter (Signed)
Spoke to Christine--ok per HIPAA--she stated that pt has a coupon card and does not need PA completed---refill sent to pharmacy and Wynona CanesChristine is aware

## 2016-04-17 NOTE — Telephone Encounter (Signed)
Received notification from [employer] stating that they are not the 3rd party for this plan.... Left detailed msg on VM per HIPAA asking pt if there has been any changes to insurance that we may not have on record---[facility]

## 2016-04-21 ENCOUNTER — Encounter: Payer: Self-pay | Admitting: Internal Medicine

## 2016-05-12 ENCOUNTER — Encounter: Payer: BLUE CROSS/BLUE SHIELD | Admitting: Internal Medicine

## 2016-05-20 ENCOUNTER — Encounter: Payer: Self-pay | Admitting: Internal Medicine

## 2016-05-20 ENCOUNTER — Ambulatory Visit (INDEPENDENT_AMBULATORY_CARE_PROVIDER_SITE_OTHER): Payer: BLUE CROSS/BLUE SHIELD | Admitting: Internal Medicine

## 2016-05-20 VITALS — BP 128/80 | HR 95 | Temp 98.5°F | Ht 70.5 in | Wt 237.0 lb

## 2016-05-20 DIAGNOSIS — Z0000 Encounter for general adult medical examination without abnormal findings: Secondary | ICD-10-CM | POA: Diagnosis not present

## 2016-05-20 DIAGNOSIS — I10 Essential (primary) hypertension: Secondary | ICD-10-CM | POA: Diagnosis not present

## 2016-05-20 DIAGNOSIS — E7800 Pure hypercholesterolemia, unspecified: Secondary | ICD-10-CM | POA: Diagnosis not present

## 2016-05-20 DIAGNOSIS — E669 Obesity, unspecified: Secondary | ICD-10-CM | POA: Diagnosis not present

## 2016-05-20 DIAGNOSIS — Z23 Encounter for immunization: Secondary | ICD-10-CM | POA: Diagnosis not present

## 2016-05-20 DIAGNOSIS — Z114 Encounter for screening for human immunodeficiency virus [HIV]: Secondary | ICD-10-CM | POA: Diagnosis not present

## 2016-05-20 DIAGNOSIS — E119 Type 2 diabetes mellitus without complications: Secondary | ICD-10-CM

## 2016-05-20 LAB — COMPREHENSIVE METABOLIC PANEL
ALT: 38 U/L (ref 0–53)
AST: 31 U/L (ref 0–37)
Albumin: 4.6 g/dL (ref 3.5–5.2)
Alkaline Phosphatase: 68 U/L (ref 39–117)
BUN: 17 mg/dL (ref 6–23)
CO2: 28 meq/L (ref 19–32)
Calcium: 9.9 mg/dL (ref 8.4–10.5)
Chloride: 99 mEq/L (ref 96–112)
Creatinine, Ser: 1.07 mg/dL (ref 0.40–1.50)
GFR: 78.87 mL/min (ref >60.00)
GLUCOSE: 96 mg/dL (ref 70–99)
POTASSIUM: 4.2 meq/L (ref 3.5–5.1)
Sodium: 137 mEq/L (ref 135–145)
Total Bilirubin: 0.8 mg/dL (ref 0.2–1.2)
Total Protein: 7.7 g/dL (ref 6.0–8.3)

## 2016-05-20 LAB — LIPID PANEL
CHOL/HDL RATIO: 5
CHOLESTEROL: 192 mg/dL (ref 0–200)
HDL: 38.9 mg/dL — AB (ref >39.00)
LDL CALC: 122 mg/dL — AB (ref 0–99)
NonHDL: 152.95
TRIGLYCERIDES: 155 mg/dL — AB (ref 0.0–149.0)
VLDL: 31 mg/dL (ref 0.0–40.0)

## 2016-05-20 LAB — CBC
HEMATOCRIT: 44.9 % (ref 39.0–52.0)
HEMOGLOBIN: 15.7 g/dL (ref 13.0–17.0)
MCHC: 35 g/dL (ref 30.0–36.0)
MCV: 85.9 fl (ref 78.0–100.0)
Platelets: 188 10*3/uL (ref 150.0–400.0)
RBC: 5.23 Mil/uL (ref 4.22–5.81)
RDW: 12.8 % (ref 11.5–15.5)
WBC: 8.9 10*3/uL (ref 4.0–10.5)

## 2016-05-20 LAB — HEMOGLOBIN A1C: Hgb A1c MFr Bld: 6.2 % (ref 4.6–6.5)

## 2016-05-20 NOTE — Patient Instructions (Signed)

## 2016-05-20 NOTE — Assessment & Plan Note (Signed)
Lipid profile and CMET today Encouraged him to consume a low fat diet LDL goal < 100, if LDL not at goal will discuss starting statin therapy which he refused in the past

## 2016-05-20 NOTE — Progress Notes (Signed)
Subjective:    Patient ID: Eric Sellers, male    DOB: 10/03/1970, 45 y.o.   MRN: [id number]  HPI  Pt presents to the clinic today for his annual exam. He is also due to follow up chronic conditions.  DM 2. His last A1C was 7.1%. He was started on Januvia after his last visit, in addition to Metformin and Glipizide. He checks his blood sugar once a day. It ranges 75-158. He does not take flu or pneumonia vaccines. His last eye exam was 01/2016.  HTN: BP fairly well controlled on Hyzaar and Norvasc. His BP today is 128/80. He reports he gets very anxious when he comes to the doctor. His BP at home ranges 100/72-128/77 . ECG from 02/2015 reviewed.  HLD: His last LDL was 141. He refused to take cholesterol lowering medication at his last visit. He does try to consume a low fat diet.  Obesity: He has not gained or lost since his last visit. His BMI is 33.53.  Flu: never Tetanus: 2008 Pneumovax: never Vision Screening: 01/2016, no retinopathy Dentist: biannually  Diet: He does eat meat. He consumes fruits and veggies daily. He does eat some fried food. He drinks mostly water or coffee. Exercise: He is doing the [employer] 2-3 days per week.    Review of Systems      Past Medical History:  Diagnosis Date  . Diabetes mellitus without complication (HCC)   . History of kidney stones   . Hypertension     Current Outpatient Prescriptions  Medication Sig Dispense Refill  . amLODipine (NORVASC) 5 MG tablet Take 1 tablet (5 mg total) by mouth daily. 90 tablet 1  . glipiZIDE (GLUCOTROL) 10 MG tablet Take 1 tablet (10 mg total) by mouth 2 (two) times daily before a meal. 180 tablet 1  . glucose blood (ONE TOUCH ULTRA TEST) test strip 1 each by Other route 2 (two) times daily as needed for other. 100 each 5  . losartan-hydrochlorothiazide (HYZAAR) 100-25 MG tablet Take 1 tablet by mouth daily. 90 tablet 1  . metFORMIN (GLUCOPHAGE) 1000 MG tablet Take 1 tablet (1,000 mg total) by  mouth 2 (two) times daily with a meal. 180 tablet 1  . Multiple Vitamins-Minerals (MULTIVITAMIN GUMMIES ADULT PO) Take 2 each by mouth daily.    Letta Pate. ONETOUCH DELICA LANCETS FINE MISC Inject 1 each into the skin 2 (two) times daily as needed. 100 each 5  . sitaGLIPtin (JANUVIA) 50 MG tablet Take 1 tablet (50 mg total) by mouth daily. 30 tablet 2   No current facility-administered medications for this visit.     Allergies  Allergen Reactions  . Penicillins     Family History  Problem Relation Age of Onset  . Diabetes Mother   . Hypertension Mother   . Diabetes Father   . Hypertension Father   . Prostate cancer Father     Social History   Social History  . Marital status: Single    Spouse name: N/A  . Number of children: N/A  . Years of education: N/A   Occupational History  . Not on file.   Social History Main Topics  . Smoking status: Never Smoker  . Smokeless tobacco: Never Used  . Alcohol use 3.0 oz/week    5 Cans of beer per week     Comment: occasional  . Drug use: No  . Sexual activity: Yes   Other Topics Concern  . Not on file   Social History  Narrative  . No narrative on file     Constitutional: Denies fever, malaise, fatigue, headache or abrupt weight changes.  HEENT: Denies eye pain, eye redness, ear pain, ringing in the ears, wax buildup, runny nose, nasal congestion, bloody nose, or sore throat. Respiratory: Denies difficulty breathing, shortness of breath, cough or sputum production.   Cardiovascular: Denies chest pain, chest tightness, palpitations or swelling in the hands or feet.  Gastrointestinal: Denies abdominal pain, bloating, constipation, diarrhea or blood in the stool.  GU: Denies urgency, frequency, pain with urination, burning sensation, blood in urine, odor or discharge. Musculoskeletal: Denies decrease in range of motion, difficulty with gait, muscle pain or joint pain and swelling.  Skin: Denies redness, rashes, lesions or ulcercations.   Neurological: Denies dizziness, difficulty with memory, difficulty with speech or problems with balance and coordination.  Psych: Pt reports anxiety. Denies depression, SI/HI.  No other specific complaints in a complete review of systems (except as listed in HPI above).  Objective:   Physical Exam   BP 128/80   Pulse 95   Temp 98.5 F (36.9 C) (Oral)   Ht 5' 10.5" (1.791 m)   Wt 237 lb (107.5 kg)   SpO2 97%   BMI 33.53 kg/m   Wt Readings from Last 3 Encounters:  02/12/16 238 lb 12 oz (108.3 kg)  08/09/15 241 lb (109.3 kg)  04/06/15 241 lb 1.9 oz (109.4 kg)    General: Appears his stated age, obese in NAD. Skin: Warm, dry and intact.  HEENT: Head: normal shape and size; Eyes: sclera white, no icterus, conjunctiva pink, PERRLA and EOMs intact; Ears: Tm's gray and intact, normal light reflex; Throat/Mouth: Teeth present, mucosa pink and moist, no exudate, lesions or ulcerations noted.  Neck: Neck supple, trachea midline. No masses, lumps or thyromegaly present.  Cardiovascular: Normal rate and rhythm. S1,S2 noted.  No murmur, rubs or gallops noted. No JVD or BLE edema. No carotid bruits noted. Pulmonary/Chest: Normal effort and positive vesicular breath sounds. No respiratory distress. No wheezes, rales or ronchi noted.  Abdomen: Soft and nontender. Normal bowel sound no bruits noted. No distention or masses noted. Liver, spleen and kidneys non palpable. Musculoskeletal: Normal range of motion. Strength 5/5 BUE/BLE. No difficulty with gait.  Neurological: Alert and oriented. Cranial nerves II-XII grossly intact. Coordination normal. Psychiatric: Mood and affect normal. Judgement and thought content is normal. Behavior is normal.   BMET    Component Value Date/Time   NA 138 02/12/2016 0925   K 3.9 02/12/2016 0925   CL 100 02/12/2016 0925   CO2 29 02/12/2016 0925   GLUCOSE 155 (H) 02/12/2016 0925   BUN 14 02/12/2016 0925   CREATININE 0.94 02/12/2016 0925   CREATININE 0.88  08/31/2013 1630   CALCIUM 9.8 02/12/2016 0925    Lipid Panel     Component Value Date/Time   CHOL 209 (H) 02/12/2016 0925   TRIG 181.0 (H) 02/12/2016 0925   HDL 31.60 (L) 02/12/2016 0925   CHOLHDL 7 02/12/2016 0925   VLDL 36.2 02/12/2016 0925   LDLCALC 141 (H) 02/12/2016 0925    CBC    Component Value Date/Time   WBC 7.6 08/09/2015 0926   RBC 5.73 08/09/2015 0926   HGB 17.0 08/09/2015 0926   HCT 48.8 08/09/2015 0926   PLT 201.0 08/09/2015 0926   MCV 85.2 08/09/2015 0926   MCH 30.0 08/31/2013 1630   MCHC 34.8 08/09/2015 0926   RDW 12.7 08/09/2015 0926   LYMPHSABS 1.9 08/31/2013 1630  MONOABS 0.8 08/31/2013 1630   EOSABS 0.1 08/31/2013 1630   BASOSABS 0.0 08/31/2013 1630    Hgb A1C Lab Results  Component Value Date   HGBA1C 7.1 (H) 02/12/2016        Assessment & Plan:   Preventative Health Maintenance:  He declines flu or pneumonia shot today Tetanus due 2018 Advised him to continue seeing an eye doctor and dentist at least year Encouraged him to consume a balanced diet and exercise regimen Will check CBC, CMET, Lipid Profile and HIV today  RTC in 3 months to follow up chronic conditions Manny Vitolo, NP

## 2016-05-20 NOTE — Assessment & Plan Note (Signed)
Will repeat A1C today No microalbumin secondary to ARB therapy Encouraged him to consume a low fat, low carb diet and exercise to lose weight Foot exam today He declines flu or pneumonia vaccines Eye exam UTD

## 2016-05-20 NOTE — Assessment & Plan Note (Signed)
Advised him to cut the Amlodipine in half and monitor his BP at home Continue Hyzaar for now CMET today

## 2016-05-20 NOTE — Assessment & Plan Note (Signed)
Encouraged him to continue to work on diet and exercise 

## 2016-05-21 LAB — HIV ANTIBODY (ROUTINE TESTING W REFLEX): HIV 1&2 Ab, 4th Generation: NONREACTIVE

## 2016-05-23 NOTE — Addendum Note (Signed)
Addended by: Roena MaladyEVONTENNO, Makaylin Carlo Y on: 05/23/2016 05:01 PM   Modules accepted: Orders

## 2016-07-14 ENCOUNTER — Other Ambulatory Visit: Payer: Self-pay | Admitting: Internal Medicine

## 2016-08-05 ENCOUNTER — Other Ambulatory Visit: Payer: Self-pay | Admitting: Internal Medicine

## 2016-08-05 DIAGNOSIS — E7800 Pure hypercholesterolemia, unspecified: Secondary | ICD-10-CM

## 2016-08-19 ENCOUNTER — Other Ambulatory Visit (INDEPENDENT_AMBULATORY_CARE_PROVIDER_SITE_OTHER): Payer: BLUE CROSS/BLUE SHIELD

## 2016-08-19 DIAGNOSIS — E7800 Pure hypercholesterolemia, unspecified: Secondary | ICD-10-CM

## 2016-08-19 LAB — LIPID PANEL
CHOLESTEROL: 196 mg/dL (ref 0–200)
HDL: 34.9 mg/dL — AB (ref >39.00)
LDL Cholesterol: 122 mg/dL — ABNORMAL HIGH (ref 0–99)
NONHDL: 161.44
Total CHOL/HDL Ratio: 6
Triglycerides: 195 mg/dL — ABNORMAL HIGH (ref 0.0–149.0)
VLDL: 39 mg/dL (ref 0.0–40.0)

## 2016-08-19 LAB — HEMOGLOBIN A1C: Hgb A1c MFr Bld: 6.9 % — ABNORMAL HIGH (ref 4.6–6.5)

## 2016-08-27 ENCOUNTER — Other Ambulatory Visit: Payer: Self-pay | Admitting: Internal Medicine

## 2016-09-22 ENCOUNTER — Other Ambulatory Visit: Payer: Self-pay | Admitting: Internal Medicine

## 2016-10-03 ENCOUNTER — Other Ambulatory Visit: Payer: Self-pay | Admitting: Internal Medicine

## 2016-11-05 ENCOUNTER — Other Ambulatory Visit: Payer: Self-pay | Admitting: Internal Medicine

## 2016-11-17 ENCOUNTER — Encounter: Payer: Self-pay | Admitting: Internal Medicine

## 2016-11-17 ENCOUNTER — Ambulatory Visit (INDEPENDENT_AMBULATORY_CARE_PROVIDER_SITE_OTHER): Payer: BLUE CROSS/BLUE SHIELD | Admitting: Internal Medicine

## 2016-11-17 VITALS — BP 116/62 | HR 85 | Temp 98.2°F | Wt 235.0 lb

## 2016-11-17 DIAGNOSIS — E7800 Pure hypercholesterolemia, unspecified: Secondary | ICD-10-CM | POA: Diagnosis not present

## 2016-11-17 DIAGNOSIS — E119 Type 2 diabetes mellitus without complications: Secondary | ICD-10-CM

## 2016-11-17 LAB — HEMOGLOBIN A1C: HEMOGLOBIN A1C: 6.4 % (ref 4.6–6.5)

## 2016-11-17 LAB — BASIC METABOLIC PANEL
BUN: 13 mg/dL (ref 6–23)
CO2: 28 meq/L (ref 19–32)
CREATININE: 1.09 mg/dL (ref 0.40–1.50)
Calcium: 9.6 mg/dL (ref 8.4–10.5)
Chloride: 106 mEq/L (ref 96–112)
GFR: 77.04 mL/min (ref >60.00)
GLUCOSE: 99 mg/dL (ref 70–99)
Potassium: 4.6 mEq/L (ref 3.5–5.1)
Sodium: 138 mEq/L (ref 135–145)

## 2016-11-17 LAB — LIPID PANEL
CHOL/HDL RATIO: 4
Cholesterol: 170 mg/dL (ref 0–200)
HDL: 44.7 mg/dL (ref >39.00)
LDL CALC: 100 mg/dL — AB (ref 0–99)
NONHDL: 125.22
Triglycerides: 124 mg/dL (ref 0.0–149.0)
VLDL: 24.8 mg/dL (ref 0.0–40.0)

## 2016-11-17 NOTE — Progress Notes (Signed)
Subjective:    Patient ID: Eric Sellers, male    DOB: October 27, 1970, 46 y.o.   MRN: [id number]  HPI  Pt presents to the clinic today to follow up DM 2 and HLD.  DM 2: His last A1C 6.9 %, 08/2016. He sugar this morning was 172 fasting. He does not check his sugar regularly. He is taking Metformin, Januvia and Glipizide. His last eye exam was 01/2017. He does not check her feet. Pneumovax 05/2016.  HLD: His last LDL was 122, 08/2016. He is not taking any cholesterol lowering medication. He does not consume a low fat diet.  Review of Systems  Past Medical History:  Diagnosis Date  . Diabetes mellitus without complication (HCC)   . History of kidney stones   . Hypertension     Current Outpatient Prescriptions  Medication Sig Dispense Refill  . amLODipine (NORVASC) 5 MG tablet TAKE 1 TABLET (5 MG TOTAL) BY MOUTH DAILY. 90 tablet 1  . glipiZIDE (GLUCOTROL) 10 MG tablet TAKE 1 TABLET (10 MG TOTAL) BY MOUTH TWICE DAILY, BEFORE A MEAL. 180 tablet 0  . glucose blood (ONE TOUCH ULTRA TEST) test strip 1 each by Other route 2 (two) times daily as needed for other. 100 each 5  . JANUVIA 50 MG tablet TAKE 1 TABLET BY MOUTH DAILY. 30 tablet 0  . losartan-hydrochlorothiazide (HYZAAR) 100-25 MG tablet TAKE 1 TABLET BY MOUTH DAILY. 90 tablet 1  . metFORMIN (GLUCOPHAGE) 1000 MG tablet TAKE 1 TABLET BY MOUTH TWICE DAILY WITH A MEAL. 180 tablet 1  . ONETOUCH DELICA LANCETS FINE MISC Inject 1 each into the skin 2 (two) times daily as needed. 100 each 5   No current facility-administered medications for this visit.     Allergies  Allergen Reactions  . Penicillins     Family History  Problem Relation Age of Onset  . Diabetes Mother   . Hypertension Mother   . Diabetes Father   . Hypertension Father   . Prostate cancer Father     Social History   Social History  . Marital status: Single    Spouse name: N/A  . Number of children: N/A  . Years of education: N/A   Occupational History  .  Not on file.   Social History Main Topics  . Smoking status: Never Smoker  . Smokeless tobacco: Never Used  . Alcohol use 3.0 oz/week    5 Cans of beer per week     Comment: occasional  . Drug use: No  . Sexual activity: Yes   Other Topics Concern  . Not on file   Social History Narrative  . No narrative on file     Constitutional: Denies fever, malaise, fatigue, headache or abrupt weight changes.  Respiratory: Denies difficulty breathing, shortness of breath, cough or sputum production.   Cardiovascular: Denies chest pain, chest tightness, palpitations or swelling in the hands or feet.  Skin: Denies redness, rashes, lesions or ulcercations.  Neurological: Denies dizziness, difficulty with memory, difficulty with speech or problems with balance and coordination.    No other specific complaints in a complete review of systems (except as listed in HPI above).     Objective:   Physical Exam  BP 116/62   Pulse 85   Temp 98.2 F (36.8 C) (Oral)   Wt 235 lb (106.6 kg)   SpO2 98%   BMI 33.24 kg/m  Wt Readings from Last 3 Encounters:  11/17/16 235 lb (106.6 kg)  05/20/16 237 lb (107.5  kg)  02/12/16 238 lb 12 oz (108.3 kg)    General: Appears his stated age, obese in NAD. Skin: Warm, dry and intact. No ulcerations noted. Cardiovascular: Normal rate and rhythm. S1,S2 noted.  No murmur, rubs or gallops noted. No carotid bruits noted. Pulmonary/Chest: Normal effort and positive vesicular breath sounds. No respiratory distress. No wheezes, rales or ronchi noted.  Neurological: Alert and oriented. Sensation intact to BLE.   BMET    Component Value Date/Time   NA 137 05/20/2016 1003   K 4.2 05/20/2016 1003   CL 99 05/20/2016 1003   CO2 28 05/20/2016 1003   GLUCOSE 96 05/20/2016 1003   BUN 17 05/20/2016 1003   CREATININE 1.07 05/20/2016 1003   CREATININE 0.88 08/31/2013 1630   CALCIUM 9.9 05/20/2016 1003    Lipid Panel     Component Value Date/Time   CHOL 196  08/19/2016 0808   TRIG 195.0 (H) 08/19/2016 0808   HDL 34.90 (L) 08/19/2016 0808   CHOLHDL 6 08/19/2016 0808   VLDL 39.0 08/19/2016 0808   LDLCALC 122 (H) 08/19/2016 0808    CBC    Component Value Date/Time   WBC 8.9 05/20/2016 1003   RBC 5.23 05/20/2016 1003   HGB 15.7 05/20/2016 1003   HCT 44.9 05/20/2016 1003   PLT 188.0 05/20/2016 1003   MCV 85.9 05/20/2016 1003   MCH 30.0 08/31/2013 1630   MCHC 35.0 05/20/2016 1003   RDW 12.8 05/20/2016 1003   LYMPHSABS 1.9 08/31/2013 1630   MONOABS 0.8 08/31/2013 1630   EOSABS 0.1 08/31/2013 1630   BASOSABS 0.0 08/31/2013 1630    Hgb A1C Lab Results  Component Value Date   HGBA1C 6.9 (H) 08/19/2016            Assessment & Plan:

## 2016-11-17 NOTE — Assessment & Plan Note (Signed)
CMET and Lipid Profile today Encouraged him to consume a low fat diet If LDL not at goal, consider statin therapy (he has refused in the past)

## 2016-11-17 NOTE — Assessment & Plan Note (Signed)
A1C today No microalbumin today secondary to ARB therapy Encouraged him to consume a low carb, low fat diet and exercise for weight loss Continue yearly eye exam Foot exam today Pneumovax UTD

## 2016-11-17 NOTE — Patient Instructions (Signed)
Fat and Cholesterol Restricted Diet Getting too much fat and cholesterol in your diet may cause health problems. Following this diet helps keep your fat and cholesterol at normal levels. This can keep you from getting sick. What types of fat should I choose?  Choose monosaturated and polyunsaturated fats. These are found in foods such as olive oil, canola oil, flaxseeds, walnuts, almonds, and seeds.  Eat more omega-3 fats. Good choices include salmon, mackerel, sardines, tuna, flaxseed oil, and ground flaxseeds.  Limit saturated fats. These are in animal products such as meats, butter, and cream. They can also be in plant products such as palm oil, palm kernel oil, and coconut oil.  Avoid foods with partially hydrogenated oils in them. These contain trans fats. Examples of foods that have trans fats are stick margarine, some tub margarines, cookies, crackers, and other baked goods. What general guidelines do I need to follow?  Check food labels. Look for the words "trans fat" and "saturated fat."  When preparing a meal:  Fill half of your plate with vegetables and green salads.  Fill one fourth of your plate with whole grains. Look for the word "whole" as the first word in the ingredient list.  Fill one fourth of your plate with lean protein foods.  Eat more foods that have fiber, like apples, carrots, beans, peas, and barley.  Eat more home-cooked foods. Eat less at restaurants and buffets.  Limit or avoid alcohol.  Limit foods high in starch and sugar.  Limit fried foods.  Cook foods without frying them. Baking, boiling, grilling, and broiling are all great options.  Lose weight if you are overweight. Losing even a small amount of weight can help your overall health. It can also help prevent diseases such as diabetes and heart disease. What foods can I eat? Grains  Whole grains, such as whole wheat or whole grain breads, crackers, cereals, and pasta. Unsweetened oatmeal,  bulgur, barley, quinoa, or brown rice. Corn or whole wheat flour tortillas. Vegetables  Fresh or frozen vegetables (raw, steamed, roasted, or grilled). Green salads. Fruits  All fresh, canned (in natural juice), or frozen fruits. Meat and Other Protein Products  Ground beef (85% or leaner), grass-fed beef, or beef trimmed of fat. Skinless chicken or turkey. Ground chicken or turkey. Pork trimmed of fat. All fish and seafood. Eggs. Dried beans, peas, or lentils. Unsalted nuts or seeds. Unsalted canned or dry beans. Dairy  Low-fat dairy products, such as skim or 1% milk, 2% or reduced-fat cheeses, low-fat ricotta or cottage cheese, or plain low-fat yogurt. Fats and Oils  Tub margarines without trans fats. Light or reduced-fat mayonnaise and salad dressings. Avocado. Olive, canola, sesame, or safflower oils. Natural peanut or almond butter (choose ones without added sugar and oil). The items listed above may not be a complete list of recommended foods or beverages. Contact your dietitian for more options.  What foods are not recommended? Grains  White bread. White pasta. White rice. Cornbread. Bagels, pastries, and croissants. Crackers that contain trans fat. Vegetables  White potatoes. Corn. Creamed or fried vegetables. Vegetables in a cheese sauce. Fruits  Dried fruits. Canned fruit in light or heavy syrup. Fruit juice. Meat and Other Protein Products  Fatty cuts of meat. Ribs, chicken wings, bacon, sausage, bologna, salami, chitterlings, fatback, hot dogs, bratwurst, and packaged luncheon meats. Liver and organ meats. Dairy  Whole or 2% milk, cream, half-and-half, and cream cheese. Whole milk cheeses. Whole-fat or sweetened yogurt. Full-fat cheeses. Nondairy creamers and whipped   toppings. Processed cheese, cheese spreads, or cheese curds. Sweets and Desserts  Corn syrup, sugars, honey, and molasses. Candy. Jam and jelly. Syrup. Sweetened cereals. Cookies, pies, cakes, donuts, muffins, and ice  cream. Fats and Oils  Butter, stick margarine, lard, shortening, ghee, or bacon fat. Coconut, palm kernel, or palm oils. Beverages  Alcohol. Sweetened drinks (such as sodas, lemonade, and fruit drinks or punches). The items listed above may not be a complete list of foods and beverages to avoid. Contact your dietitian for more information.  This information is not intended to replace advice given to you by your health care provider. Make sure you discuss any questions you have with your health care provider. Document Released: 12/23/2011 Document Revised: 02/28/2016 Document Reviewed: 09/22/2013 Elsevier Interactive Patient Education  2017 Elsevier Inc.  

## 2016-12-09 ENCOUNTER — Other Ambulatory Visit: Payer: Self-pay | Admitting: Internal Medicine

## 2016-12-23 ENCOUNTER — Other Ambulatory Visit: Payer: Self-pay | Admitting: Internal Medicine

## 2017-02-23 ENCOUNTER — Ambulatory Visit (INDEPENDENT_AMBULATORY_CARE_PROVIDER_SITE_OTHER)
Admission: RE | Admit: 2017-02-23 | Discharge: 2017-02-23 | Disposition: A | Discharge status: Home / Self Care | Payer: BLUE CROSS/BLUE SHIELD | Source: Ambulatory Visit | Attending: Internal Medicine | Admitting: Internal Medicine

## 2017-02-23 ENCOUNTER — Encounter: Payer: Self-pay | Admitting: Internal Medicine

## 2017-02-23 ENCOUNTER — Ambulatory Visit (INDEPENDENT_AMBULATORY_CARE_PROVIDER_SITE_OTHER): Payer: BLUE CROSS/BLUE SHIELD | Admitting: Internal Medicine

## 2017-02-23 ENCOUNTER — Ambulatory Visit: Payer: BLUE CROSS/BLUE SHIELD | Admitting: Internal Medicine

## 2017-02-23 VITALS — BP 128/78 | HR 60 | Temp 97.9°F | Wt 229.5 lb

## 2017-02-23 DIAGNOSIS — M25522 Pain in left elbow: Secondary | ICD-10-CM

## 2017-02-23 DIAGNOSIS — M7712 Lateral epicondylitis, left elbow: Secondary | ICD-10-CM

## 2017-02-23 DIAGNOSIS — E119 Type 2 diabetes mellitus without complications: Secondary | ICD-10-CM | POA: Diagnosis not present

## 2017-02-23 LAB — HEMOGLOBIN A1C: Hgb A1c MFr Bld: 6.1 % (ref 4.6–6.5)

## 2017-02-23 MED ORDER — PREDNISONE 10 MG PO TABS: ORAL_TABLET | ORAL | 0 refills | Status: DC

## 2017-02-23 NOTE — Patient Instructions (Signed)
Tennis Elbow Rehab  Ask your health care provider which exercises are safe for you. Do exercises exactly as told by your health care provider and adjust them as directed. It is normal to feel mild stretching, pulling, tightness, or discomfort as you do these exercises, but you should stop right away if you feel sudden pain or your pain gets worse. Do not begin these exercises until told by your health care provider.  Stretching and range of motion exercises  These exercises warm up your muscles and joints and improve the movement and flexibility of your elbow. These exercises also help to relieve pain, numbness, and tingling.  Exercise A: Wrist extensor stretch  1. Extend your left / right elbow with your fingers pointing down.  2. Gently pull the palm of your left / right hand toward you until you feel a gentle stretch on the top of your forearm.  3. To increase the stretch, push your left / right hand toward the outer edge or pinkie side of your forearm.  4. Hold this position for __________ seconds.  Repeat __________ times. Complete this exercise __________ times a day.  If directed by your health care provider, repeat this stretch except do it with a bent elbow this time.  Exercise B: Wrist flexor stretch    1. Extend your left / right elbow and turn your palm upward.  2. Gently pull your left / right palm and fingertips back so your wrist extends and your fingers point more toward the ground.  3. You should feel a gentle stretch on the inside of your forearm.  4. Hold this position for __________ seconds.  Repeat __________ times. Complete this exercise __________ times a day.  If directed by your health care provider, repeat this stretch except do it with a bent elbow this time.  Strengthening exercises  These exercises build strength and endurance in your elbow. Endurance is the ability to use your muscles for a long time, even after they get tired.  Exercise C: Wrist extensors    1. Sit with your left /  right forearm palm-down and fully supported on a table or countertop. Your elbow should be resting below the height of your shoulder.  2. Let your left / right wrist extend over the edge of the surface.  3. Loosely hold a __________ weight or a piece of rubber exercise band or tubing in your left / right hand. Slowly curl your left / right hand up toward your forearm. If you are using band or tubing, hold the band or tubing in place with your other hand to provide resistance.  4. Hold this position for __________ seconds.  5. Slowly return to the starting position.  Repeat __________ times. Complete this exercise __________ times a day.  Exercise D: Radial deviators    1. Stand with a __________ weight in your left / righthand. Or, sit while holding a rubber exercise band or tubing with your other arm supported on a table or countertop. Position your hand so your thumb is on top.  2. Raise your hand upward in front of you so your thumb travels toward your forearm, or pull up on the rubber tubing.  3. Hold this position for __________ seconds.  4. Slowly return to the starting position.  Repeat __________ times. Complete this exercise __________ times a day.  Exercise E: Eccentric wrist extensors  1. Sit with your left / right forearm palm-down and fully supported on a table or countertop. Your   elbow should be resting below the height of your shoulder.  2. If told by your health care provider, hold a __________ weight in your hand.  3. Let your left / right wrist extend over the edge of the surface.  4. Use your other hand to lift up your left / right hand toward your forearm. Keep your forearm on the table.  5. Using only the muscles in your left / right hand, slowly lower your hand back down to the starting position.  Repeat __________ times. Complete this exercise __________ times a day.  This information is not intended to replace advice given to you by your health care provider. Make sure you discuss any  questions you have with your health care provider.  Document Released: 06/23/2005 Document Revised: 02/27/2016 Document Reviewed: 03/22/2015  Elsevier Interactive Patient Education  2018 Elsevier Inc.

## 2017-02-23 NOTE — Assessment & Plan Note (Signed)
A1C today No microablumin secondary to ARB therapy Encouraged him to consume a low carb diet and exercise for weight loss Foot exam today Encouraged yearly eye exams Continue Metformin, Januvia and Glipizide for now

## 2017-02-23 NOTE — Progress Notes (Signed)
Subjective:    Patient ID: Eric Sellers, male    DOB: May 01, 1971, 46 y.o.   MRN: [id number]  HPI  Pt presents to the clinic today for 3 month follow up of DM 2. His last A1C was 6.5%, 11/2016. He is taking Glipizide, Metformin and Januvia as prescribed. His sugars range 64-135. He checks his feet weekly. His last eye exam was 1 year ago.  He also reports left elbow pain. This has been going on for the last year. The pain is worse with bending the elbow. He denies numbness or tingling in the left arm. He denies redness or swelling. He has tried wearing a tennis elbow brace and Tylenol with minimal relief.  Review of Systems  Past Medical History:  Diagnosis Date  . Diabetes mellitus without complication (HCC)   . History of kidney stones   . Hypertension     Current Outpatient Prescriptions  Medication Sig Dispense Refill  . amLODipine (NORVASC) 5 MG tablet TAKE 1 TABLET (5 MG TOTAL) BY MOUTH DAILY. 90 tablet 1  . glipiZIDE (GLUCOTROL) 10 MG tablet TAKE 1 TABLET BY MOUTH TWICE DAILY BEFORE A MEAL. 180 tablet 1  . glucose blood (ONE TOUCH ULTRA TEST) test strip 1 each by Other route 2 (two) times daily as needed for other. 100 each 5  . JANUVIA 50 MG tablet TAKE 1 TABLET BY MOUTH DAILY. 30 tablet 2  . losartan-hydrochlorothiazide (HYZAAR) 100-25 MG tablet TAKE 1 TABLET BY MOUTH DAILY. 90 tablet 1  . metFORMIN (GLUCOPHAGE) 1000 MG tablet TAKE 1 TABLET BY MOUTH TWICE DAILY WITH A MEAL. 180 tablet 1  . ONETOUCH DELICA LANCETS FINE MISC Inject 1 each into the skin 2 (two) times daily as needed. 100 each 5   No current facility-administered medications for this visit.     Allergies  Allergen Reactions  . Penicillins     Family History  Problem Relation Age of Onset  . Diabetes Mother   . Hypertension Mother   . Diabetes Father   . Hypertension Father   . Prostate cancer Father     Social History   Social History  . Marital status: Single    Spouse name: N/A  . Number of  children: N/A  . Years of education: N/A   Occupational History  . Not on file.   Social History Main Topics  . Smoking status: Never Smoker  . Smokeless tobacco: Never Used  . Alcohol use 3.0 oz/week    5 Cans of beer per week     Comment: occasional  . Drug use: No  . Sexual activity: Yes   Other Topics Concern  . Not on file   Social History Narrative  . No narrative on file     Constitutional: Denies fever, malaise, fatigue, headache or abrupt weight changes.  Musculoskeletal: Pt reports left elbow pain. Denies decrease in range of motion, difficulty with gait, muscle pain or joint swelling.  Skin: Denies redness, rashes, lesions or ulcercations.    No other specific complaints in a complete review of systems (except as listed in HPI above).     Objective:   Physical Exam  BP 128/78   Pulse 60   Temp 97.9 F (36.6 C) (Oral)   Wt 229 lb 8 oz (104.1 kg)   SpO2 98%   BMI 32.46 kg/m  Wt Readings from Last 3 Encounters:  02/23/17 229 lb 8 oz (104.1 kg)  11/17/16 235 lb (106.6 kg)  05/20/16 237 lb (107.5  kg)    General: Appears his stated age, obese in NAD. Skin: Warm, dry and intact. No ulcerations noted. Cardiovascular: Normal rate and rhythm.  Pulmonary/Chest: Normal effort and positive vesicular breath sounds. No respiratory distress. No wheezes, rales or ronchi noted.  Musculoskeletal: Normal flexion, extension and rotation of the left elbow. No joint swelling noted. Pain with palpation over the lateral epicondyle. Strength 5/5 BUE.  Neurological: Alert and oriented. Sensation intact to BLE.    BMET    Component Value Date/Time   NA 138 11/17/2016 0908   K 4.6 11/17/2016 0908   CL 106 11/17/2016 0908   CO2 28 11/17/2016 0908   GLUCOSE 99 11/17/2016 0908   BUN 13 11/17/2016 0908   CREATININE 1.09 11/17/2016 0908   CREATININE 0.88 08/31/2013 1630   CALCIUM 9.6 11/17/2016 0908    Lipid Panel     Component Value Date/Time   CHOL 170 11/17/2016  0908   TRIG 124.0 11/17/2016 0908   HDL 44.70 11/17/2016 0908   CHOLHDL 4 11/17/2016 0908   VLDL 24.8 11/17/2016 0908   LDLCALC 100 (H) 11/17/2016 0908    CBC    Component Value Date/Time   WBC 8.9 05/20/2016 1003   RBC 5.23 05/20/2016 1003   HGB 15.7 05/20/2016 1003   HCT 44.9 05/20/2016 1003   PLT 188.0 05/20/2016 1003   MCV 85.9 05/20/2016 1003   MCH 30.0 08/31/2013 1630   MCHC 35.0 05/20/2016 1003   RDW 12.8 05/20/2016 1003   LYMPHSABS 1.9 08/31/2013 1630   MONOABS 0.8 08/31/2013 1630   EOSABS 0.1 08/31/2013 1630   BASOSABS 0.0 08/31/2013 1630    Hgb A1C Lab Results  Component Value Date   HGBA1C 6.4 11/17/2016            Assessment & Plan:   Left Elbow Pain;  Likely lateral epicondyle Xray left elbow today eRx for Pred Taper x 6 days, monitor sugars Elbow exercises given  RTC in 3 months for your annual exam Nicki Reaper, NP

## 2017-02-27 ENCOUNTER — Telehealth: Payer: Self-pay

## 2017-02-27 NOTE — Telephone Encounter (Signed)
Eric Sellers(DPR signed) left v/m wanting to clarify with the prednisone rx should pt take 3 tabs at same time on day 1-2?and should the prednisone be taken with food. Advised pts wife that pt can take the 3 tabs of prednisone at the same time on day 1 and 2 and to take with food. Eric Sellers voiced understanding and nothing further needed. FYI to Pamala Hurry NP.

## 2017-02-27 NOTE — Telephone Encounter (Signed)
noted 

## 2017-03-23 ENCOUNTER — Other Ambulatory Visit: Payer: Self-pay | Admitting: Internal Medicine

## 2017-05-04 ENCOUNTER — Encounter: Payer: Self-pay | Admitting: Internal Medicine

## 2017-05-04 NOTE — Telephone Encounter (Signed)
Baity pt, xray from 02/23/17 below, please advise  FINDINGS: The bones of the elbow were subjectively adequately mineralized. There is no acute or healing fracture. There is no lytic or blastic lesion. The articular surfaces appear smooth. No significant osteophyte formation is observed. There is no joint effusion. Specific attention to the lap lateral epicondyles reveals no bony abnormality.  IMPRESSION: There is no acute or significant chronic bony abnormality of the left elbow.

## 2017-05-04 NOTE — Telephone Encounter (Signed)
Will forward to PCP--expect her back tomorrow

## 2017-05-26 ENCOUNTER — Other Ambulatory Visit: Payer: Self-pay | Admitting: Internal Medicine

## 2017-07-08 ENCOUNTER — Other Ambulatory Visit: Payer: Self-pay | Admitting: Internal Medicine

## 2017-08-24 ENCOUNTER — Other Ambulatory Visit: Payer: Self-pay | Admitting: Internal Medicine

## 2017-09-14 ENCOUNTER — Ambulatory Visit: Payer: BLUE CROSS/BLUE SHIELD | Admitting: Internal Medicine

## 2017-09-14 ENCOUNTER — Encounter: Payer: Self-pay | Admitting: Internal Medicine

## 2017-09-14 VITALS — BP 130/80 | HR 87 | Temp 98.0°F | Wt 236.0 lb

## 2017-09-14 DIAGNOSIS — E119 Type 2 diabetes mellitus without complications: Secondary | ICD-10-CM | POA: Diagnosis not present

## 2017-09-14 DIAGNOSIS — I10 Essential (primary) hypertension: Secondary | ICD-10-CM

## 2017-09-14 LAB — COMPREHENSIVE METABOLIC PANEL
ALBUMIN: 4.4 g/dL (ref 3.5–5.2)
ALK PHOS: 73 U/L (ref 39–117)
ALT: 34 U/L (ref 0–53)
AST: 29 U/L (ref 0–37)
BILIRUBIN TOTAL: 1.1 mg/dL (ref 0.2–1.2)
BUN: 16 mg/dL (ref 6–23)
CALCIUM: 9.5 mg/dL (ref 8.4–10.5)
CO2: 26 mEq/L (ref 19–32)
Chloride: 99 mEq/L (ref 96–112)
Creatinine, Ser: 0.92 mg/dL (ref 0.40–1.50)
GFR: 93.36 mL/min (ref >60.00)
Glucose, Bld: 204 mg/dL — ABNORMAL HIGH (ref 70–99)
Potassium: 3.8 mEq/L (ref 3.5–5.1)
Sodium: 133 mEq/L — ABNORMAL LOW (ref 135–145)
Total Protein: 7.4 g/dL (ref 6.0–8.3)

## 2017-09-14 LAB — LIPID PANEL
CHOL/HDL RATIO: 5
CHOLESTEROL: 188 mg/dL (ref 0–200)
HDL: 36.8 mg/dL — AB (ref >39.00)
LDL Cholesterol: 123 mg/dL — ABNORMAL HIGH (ref 0–99)
NonHDL: 150.79
TRIGLYCERIDES: 141 mg/dL (ref 0.0–149.0)
VLDL: 28.2 mg/dL (ref 0.0–40.0)

## 2017-09-14 LAB — HEMOGLOBIN A1C: Hgb A1c MFr Bld: 7.4 % — ABNORMAL HIGH (ref 4.6–6.5)

## 2017-09-14 NOTE — Assessment & Plan Note (Signed)
CMET, A1C and Lipid profile today Encourage him to consume a low carb diet and exercise for weight loss Continue Metformin and Glipizide Will restart Januvia if A1C> 7.5% No microalbumin secondary to ARB therapy Foot exam today Encouraged yearly eye exam He declines flu or pneumonia vaccine

## 2017-09-14 NOTE — Patient Instructions (Signed)

## 2017-09-14 NOTE — Progress Notes (Signed)
Subjective:    Patient ID: Eric Sellers, male    DOB: Feb 25, 1971, 47 y.o.   MRN: [id number]  HPI  Pt presents to the clinic today to follow up chronic conditions.   DM 2: His sugars range 110-140. He is taking Metformin, Glipizide. He was taken off the Januvia for A1C < 6%. He denies numbness or tingling in his hands or feet. 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 He goes to the gym 3 x week , although he reports he has gained 7 lbs. He does not take flu or pneumonia shots.   HTN: His BP today is 130/80. He is taking Losartan HCT and Amlodipine as prescribed. ECG from 02/2015 reviewed.  Review of Systems      Past Medical History:  Diagnosis Date  . Diabetes mellitus without complication (HCC)   . History of kidney stones   . Hypertension     Current Outpatient Medications  Medication Sig Dispense Refill  . amLODipine (NORVASC) 5 MG tablet Take 1 tablet (5 mg total) by mouth daily. MUST SCHEDULE ANNUAL PHYSICAL 90 tablet 0  . glipiZIDE (GLUCOTROL) 10 MG tablet TAKE 1 TABLET BY MOUTH TWICE DAILY BEFORE A MEAL. 180 tablet 1  . glucose blood (ONE TOUCH ULTRA TEST) test strip 1 each by Other route 2 (two) times daily as needed for other. 100 each 5  . JANUVIA 50 MG tablet TAKE 1 TABLET BY MOUTH DAILY. 30 tablet 2  . losartan-hydrochlorothiazide (HYZAAR) 100-25 MG tablet Take 1 tablet by mouth daily. MUST SCHEDULE ANNUAL PHYSICAL 90 tablet 0  . metFORMIN (GLUCOPHAGE) 1000 MG tablet TAKE 1 TABLET BY MOUTH TWICE DAILY WITH A MEAL. 180 tablet 1  . ONETOUCH DELICA LANCETS FINE MISC Inject 1 each into the skin 2 (two) times daily as needed. 100 each 5   No current facility-administered medications for this visit.     Allergies  Allergen Reactions  . Penicillins     Family History  Problem Relation Age of Onset  . Diabetes Mother   . Hypertension Mother   . Diabetes Father   . Hypertension Father   . Prostate cancer  Father     Social History   Socioeconomic History  . Marital status: Single    Spouse name: Not on file  . Number of children: Not on file  . Years of education: Not on file  . Highest education level: Not on file  Social Needs  . Financial resource strain: Not on file  . Food insecurity - worry: Not on file  . Food insecurity - inability: Not on file  . Transportation needs - medical: Not on file  . Transportation needs - non-medical: Not on file  Occupational History  . Not on file  Tobacco Use  . Smoking status: Never Smoker  . Smokeless tobacco: Never Used  Substance and Sexual Activity  . Alcohol use: Yes    Alcohol/week: 3.0 oz    Types: 5 Cans of beer per week    Comment: occasional  . Drug use: No  . Sexual activity: Yes  Other Topics Concern  . Not on file  Social History Narrative  . Not on file     Constitutional: Pt reports weight gain. Denies fever, malaise, fatigue, headache.  Respiratory: Denies difficulty breathing, shortness of breath, cough or sputum production.   Cardiovascular: Denies chest pain, chest tightness, palpitations or swelling in the hands  or feet.  Skin: Denies redness, rashes, lesions or ulcercations.  Neurological: Denies dizziness, difficulty with memory, difficulty with speech or problems with balance and coordination.   No other specific complaints in a complete review of systems (except as listed in HPI above).  Objective:   Physical Exam   BP 130/80   Pulse 87   Temp 98 F (36.7 C) (Oral)   Wt 236 lb (107 kg)   SpO2 97%   BMI 33.38 kg/m  Wt Readings from Last 3 Encounters:  09/14/17 236 lb (107 kg)  02/23/17 229 lb 8 oz (104.1 kg)  11/17/16 235 lb (106.6 kg)    General: Appears his stated age, obese in NAD. Skin: Warm, dry and intact. No ulcerations noted. Cardiovascular: Normal rate and rhythm. S1,S2 noted.  No murmur, rubs or gallops noted. No JVD or BLE edema. No carotid bruits noted. Pulmonary/Chest: Normal  effort and positive vesicular breath sounds. No respiratory distress. No wheezes, rales or ronchi noted.  Neurological: Alert and oriented. Sensation intact to BLE.   BMET    Component Value Date/Time   NA 138 11/17/2016 0908   K 4.6 11/17/2016 0908   CL 106 11/17/2016 0908   CO2 28 11/17/2016 0908   GLUCOSE 99 11/17/2016 0908   BUN 13 11/17/2016 0908   CREATININE 1.09 11/17/2016 0908   CREATININE 0.88 08/31/2013 1630   CALCIUM 9.6 11/17/2016 0908    Lipid Panel     Component Value Date/Time   CHOL 170 11/17/2016 0908   TRIG 124.0 11/17/2016 0908   HDL 44.70 11/17/2016 0908   CHOLHDL 4 11/17/2016 0908   VLDL 24.8 11/17/2016 0908   LDLCALC 100 (H) 11/17/2016 0908    CBC    Component Value Date/Time   WBC 8.9 05/20/2016 1003   RBC 5.23 05/20/2016 1003   HGB 15.7 05/20/2016 1003   HCT 44.9 05/20/2016 1003   PLT 188.0 05/20/2016 1003   MCV 85.9 05/20/2016 1003   MCH 30.0 08/31/2013 1630   MCHC 35.0 05/20/2016 1003   RDW 12.8 05/20/2016 1003   LYMPHSABS 1.9 08/31/2013 1630   MONOABS 0.8 08/31/2013 1630   EOSABS 0.1 08/31/2013 1630   BASOSABS 0.0 08/31/2013 1630    Hgb A1C Lab Results  Component Value Date   HGBA1C 6.1 02/23/2017           Assessment & Plan:

## 2017-09-14 NOTE — Assessment & Plan Note (Signed)
Controlled on Losartan HCT and Amlodipine Reinforced DASH diet and exercise for weight loss CBC and CMET today

## 2017-09-17 ENCOUNTER — Other Ambulatory Visit: Payer: Self-pay | Admitting: Internal Medicine

## 2017-09-17 MED ORDER — SITAGLIPTIN PHOS-METFORMIN HCL 50-1000 MG PO TABS: 1.0000 | ORAL_TABLET | Freq: Two times a day (BID) | ORAL | 2 refills | Status: DC

## 2017-09-17 NOTE — Progress Notes (Signed)
jan

## 2017-11-12 ENCOUNTER — Other Ambulatory Visit: Payer: Self-pay | Admitting: Internal Medicine

## 2017-12-15 ENCOUNTER — Other Ambulatory Visit: Payer: Self-pay | Admitting: Internal Medicine

## 2017-12-21 ENCOUNTER — Encounter: Payer: Self-pay | Admitting: Internal Medicine

## 2017-12-21 LAB — HM DIABETES EYE EXAM

## 2017-12-23 ENCOUNTER — Other Ambulatory Visit: Payer: Self-pay | Admitting: Internal Medicine

## 2017-12-23 DIAGNOSIS — E119 Type 2 diabetes mellitus without complications: Secondary | ICD-10-CM

## 2017-12-24 ENCOUNTER — Other Ambulatory Visit: Payer: Self-pay | Admitting: Internal Medicine

## 2017-12-25 NOTE — Telephone Encounter (Signed)
Overdue CPE letter mailed to pt 

## 2018-01-05 ENCOUNTER — Telehealth: Payer: Self-pay

## 2018-01-05 NOTE — Telephone Encounter (Signed)
Copied from CRM [phone]. Topic: Appointment Scheduling - Scheduling Inquiry for Clinic >> Jan 05, 2018  1:29 PM Oneal GroutSebastian, Jennifer S wrote: Reason for CRM: Requesting to switch [facility]

## 2018-01-05 NOTE — Telephone Encounter (Signed)
Fine with me, very nice guy

## 2018-01-06 NOTE — Telephone Encounter (Signed)
I spoke to Wilhoithristine and let her know it was fine with Rene Kocheregina for patient to switch to [facility]. I gave her the number to [facility] to schedule appointment.

## 2018-01-12 ENCOUNTER — Ambulatory Visit: Payer: BLUE CROSS/BLUE SHIELD | Admitting: Family Medicine

## 2018-01-12 ENCOUNTER — Encounter: Payer: Self-pay | Admitting: Family Medicine

## 2018-01-12 VITALS — BP 134/88 | HR 82 | Temp 97.9°F | Ht 70.5 in | Wt 240.8 lb

## 2018-01-12 DIAGNOSIS — E7800 Pure hypercholesterolemia, unspecified: Secondary | ICD-10-CM

## 2018-01-12 DIAGNOSIS — E119 Type 2 diabetes mellitus without complications: Secondary | ICD-10-CM

## 2018-01-12 DIAGNOSIS — Z0000 Encounter for general adult medical examination without abnormal findings: Secondary | ICD-10-CM | POA: Diagnosis not present

## 2018-01-12 DIAGNOSIS — I10 Essential (primary) hypertension: Secondary | ICD-10-CM | POA: Diagnosis not present

## 2018-01-12 DIAGNOSIS — Z125 Encounter for screening for malignant neoplasm of prostate: Secondary | ICD-10-CM | POA: Insufficient documentation

## 2018-01-12 DIAGNOSIS — Z23 Encounter for immunization: Secondary | ICD-10-CM

## 2018-01-12 LAB — LIPID PANEL
CHOL/HDL RATIO: 5
Cholesterol: 188 mg/dL (ref 0–200)
HDL: 40.3 mg/dL (ref >39.00)
LDL CALC: 118 mg/dL — AB (ref 0–99)
NonHDL: 147.82
TRIGLYCERIDES: 148 mg/dL (ref 0.0–149.0)
VLDL: 29.6 mg/dL (ref 0.0–40.0)

## 2018-01-12 LAB — CBC
HCT: 45.2 % (ref 39.0–52.0)
HEMOGLOBIN: 15.8 g/dL (ref 13.0–17.0)
MCHC: 34.8 g/dL (ref 30.0–36.0)
MCV: 85.3 fl (ref 78.0–100.0)
PLATELETS: 206 10*3/uL (ref 150.0–400.0)
RBC: 5.3 Mil/uL (ref 4.22–5.81)
RDW: 13 % (ref 11.5–15.5)
WBC: 6.7 10*3/uL (ref 4.0–10.5)

## 2018-01-12 LAB — COMPREHENSIVE METABOLIC PANEL
ALT: 50 U/L (ref 0–53)
AST: 36 U/L (ref 0–37)
Albumin: 4.5 g/dL (ref 3.5–5.2)
Alkaline Phosphatase: 66 U/L (ref 39–117)
BUN: 18 mg/dL (ref 6–23)
CHLORIDE: 99 meq/L (ref 96–112)
CO2: 28 meq/L (ref 19–32)
Calcium: 9.7 mg/dL (ref 8.4–10.5)
Creatinine, Ser: 1 mg/dL (ref 0.40–1.50)
GFR: 84.68 mL/min (ref >60.00)
GLUCOSE: 168 mg/dL — AB (ref 70–99)
POTASSIUM: 4.3 meq/L (ref 3.5–5.1)
SODIUM: 135 meq/L (ref 135–145)
TOTAL PROTEIN: 7.7 g/dL (ref 6.0–8.3)
Total Bilirubin: 1.1 mg/dL (ref 0.2–1.2)

## 2018-01-12 LAB — MICROALBUMIN / CREATININE URINE RATIO
CREATININE, U: 133.2 mg/dL
MICROALB/CREAT RATIO: 0.7 mg/g (ref 0.0–30.0)
Microalb, Ur: 0.9 mg/dL (ref 0.0–1.9)

## 2018-01-12 LAB — TSH: TSH: 1.27 u[IU]/mL (ref 0.35–4.50)

## 2018-01-12 LAB — HEMOGLOBIN A1C: Hgb A1c MFr Bld: 7.5 % — ABNORMAL HIGH (ref 4.6–6.5)

## 2018-01-12 MED ORDER — SITAGLIPTIN PHOS-METFORMIN HCL 50-1000 MG PO TABS: ORAL_TABLET | ORAL | 6 refills | Status: DC

## 2018-01-12 MED ORDER — GLUCOSE BLOOD VI STRP: ORAL_STRIP | 12 refills | Status: DC

## 2018-01-12 MED ORDER — CONTOUR BLOOD GLUCOSE SYSTEM W/DEVICE KIT: PACK | 0 refills | Status: AC

## 2018-01-12 NOTE — Assessment & Plan Note (Signed)
BP remains well controlled, continue current medications and follow low salt diet.

## 2018-01-12 NOTE — Patient Instructions (Signed)

## 2018-01-12 NOTE — Assessment & Plan Note (Signed)
Well adult, additional chronic issues addressed today as well.  Orders Placed This Encounter  Procedures  . Tdap vaccine greater than or equal to 48yo IM  . Comp Met (CMET)  . CBC  . TSH  . Lipid Profile  . Urine Microalbumin w/creat. ratio  . HgB A1c  Immunizations: Tdap given Screenings: Up to date Anticipatory guidance/Risk factor reduction:  Recommend statin for reduction CVD risk, declines at this time.  Additional recommendations per AVS

## 2018-01-12 NOTE — Progress Notes (Signed)
Eric Sellers - 47 y.o. male MRN [id number]  Date of birth: 02/16/1971  Subjective Chief Complaint  Patient presents with  . Establish Care    est care, CPE W/ fasting.    HPI Eric Sellers is a 47 y.o. male here today to establish with new pcp and for annual cpe.  He has a history of diabetes, htn and hld as well.   -Diabetes:  Reports blood sugars have been a little higher lately.  Admits he could do much better in regards to his diet.  Follows a fairly carb heavy diet with large portions.  Glucose readings at home are around 130 fasting but as high as 200 on random testing.  He denies any symptoms of hypoglycemia and is compliant with medications.  He is exercising 3-4x/week at [employer].    -HTN:  Compliant with amlodipine and losartan/hctz.  BP readings have been well controlled at home.  Tries to watch salt intake.   Review of Systems  Constitutional: Negative for chills, fever, malaise/fatigue and weight loss.  HENT: Negative for congestion, ear pain and sore throat.   Eyes: Negative for blurred vision, double vision and pain.  Respiratory: Negative for cough and shortness of breath.   Cardiovascular: Negative for chest pain and palpitations.  Gastrointestinal: Negative for abdominal pain, blood in stool, constipation, heartburn and nausea.  Genitourinary: Negative for dysuria and urgency.  Musculoskeletal: Negative for joint pain and myalgias.  Neurological: Negative for dizziness and headaches.  Endo/Heme/Allergies: Does not bruise/bleed easily.  Psychiatric/Behavioral: Negative for depression. The patient is not nervous/anxious and does not have insomnia.     Allergies  Allergen Reactions  . Penicillins     Past Medical History:  Diagnosis Date  . Diabetes mellitus without complication ([place])   . History of kidney stones   . Hypertension     Past Surgical History:  Procedure Laterality Date  . TONSILLECTOMY      Social History   Socioeconomic  History  . Marital status: Single    Spouse name: Not on file  . Number of children: Not on file  . Years of education: Not on file  . Highest education level: Not on file  Occupational History  . Not on file  Social Needs  . Financial resource strain: Not on file  . Food insecurity:    Worry: Not on file    Inability: Not on file  . Transportation needs:    Medical: Not on file    Non-medical: Not on file  Tobacco Use  . Smoking status: Never Smoker  . Smokeless tobacco: Never Used  Substance and Sexual Activity  . Alcohol use: Yes    Alcohol/week: 3.0 oz    Types: 5 Cans of beer per week    Comment: occasional  . Drug use: No  . Sexual activity: Yes  Lifestyle  . Physical activity:    Days per week: Not on file    Minutes per session: Not on file  . Stress: Not on file  Relationships  . Social connections:    Talks on phone: Not on file    Gets together: Not on file    Attends religious service: Not on file    Active member of club or organization: Not on file    Attends meetings of clubs or organizations: Not on file    Relationship status: Not on file  Other Topics Concern  . Not on file  Social History Narrative  . Not on file  Family History  Problem Relation Age of Onset  . Diabetes Mother   . Hypertension Mother   . Diabetes Father   . Hypertension Father   . Prostate cancer Father     Health Maintenance  Topic Date Due  . TETANUS/TDAP  07/07/2016  . INFLUENZA VACCINE  05/17/2029 (Originally 02/04/2018)  . HEMOGLOBIN A1C  03/17/2018  . FOOT EXAM  09/15/2018  . OPHTHALMOLOGY EXAM  12/22/2018  . PNEUMOCOCCAL POLYSACCHARIDE VACCINE (2) 05/20/2021  . HIV Screening  Completed    ----------------------------------------------------------------------------------------------------------------------------------------------------------------------------------------------------------------- Physical Exam BP 134/88 (BP Location: Left Arm, Patient  Position: Sitting, Cuff Size: Normal)   Pulse 82   Temp 97.9 F (36.6 C) (Oral)   Ht 5' 10.5" (1.791 m)   Wt 240 lb 12.8 oz (109.2 kg)   SpO2 97%   BMI 34.06 kg/m   Physical Exam  Constitutional: He is oriented to person, place, and time. He appears well-nourished. No distress.  HENT:  Head: Normocephalic and atraumatic.  Mouth/Throat: Oropharynx is clear and moist.  Eyes: Pupils are equal, round, and reactive to light. EOM are normal. No scleral icterus.  Neck: Neck supple. No thyromegaly present.  Cardiovascular: Normal rate, regular rhythm, normal heart sounds and intact distal pulses.  Pulmonary/Chest: Effort normal and breath sounds normal.  Abdominal: Soft. Bowel sounds are normal.  Musculoskeletal: He exhibits no edema or tenderness.  Lymphadenopathy:    He has no cervical adenopathy.  Neurological: He is alert and oriented to person, place, and time. No cranial nerve deficit. Coordination normal.  Skin: Skin is warm and dry. No rash noted.  Psychiatric: He has a normal mood and affect. His behavior is normal.    ------------------------------------------------------------------------------------------------------------------------------------------------------------------------------------------------------------------- Assessment and Plan  HTN (hypertension) BP remains well controlled, continue current medications and follow low salt diet.   DM2 (diabetes mellitus, type 2) Discussed focusing on low carb foods, limiting portion size Update a1c and microalbumin today.  Continue monitoring glucose at home.  Encouraged to continue regular exercise.    HLD (hyperlipidemia) Elevated cholesterol previously Recommend statin especially with history of diabetes Would like to check this again first, lipid panel ordered.   Well adult exam Well adult, additional chronic issues addressed today as well.  Orders Placed This Encounter  Procedures  . Tdap vaccine greater than  or equal to 7yo IM  . Comp Met (CMET)  . CBC  . TSH  . Lipid Profile  . Urine Microalbumin w/creat. ratio  . HgB A1c  Immunizations: Tdap given Screenings: Up to date Anticipatory guidance/Risk factor reduction:  Recommend statin for reduction CVD risk, declines at this time.  Additional recommendations per AVS

## 2018-01-12 NOTE — Assessment & Plan Note (Signed)
Elevated cholesterol previously Recommend statin especially with history of diabetes Would like to check this again first, lipid panel ordered.

## 2018-01-12 NOTE — Assessment & Plan Note (Signed)
Discussed focusing on low carb foods, limiting portion size Update a1c and microalbumin today.  Continue monitoring glucose at home.  Encouraged to continue regular exercise.

## 2018-01-14 ENCOUNTER — Telehealth: Payer: Self-pay | Admitting: Family Medicine

## 2018-01-14 NOTE — Telephone Encounter (Signed)
Copied from CRM [id number]. Topic: Quick Communication - Lab Results >> Jan 14, 2018  2:52 PM Reuben LikesGane, Tonia, LPN wrote: Called patient to inform them of 7.11.19 lab results. When patient returns call, triage nurse may disclose results.

## 2018-01-14 NOTE — Progress Notes (Signed)
-  A1c relatively unchanged since last check.  Should focus on low carb intake and regular exercise to continue to improve this.  I don't think he needs additional medication currently.  -LDL cholesterol is elevated, similar to previous reading.  Would recommend medication to lower his risk for heart disease with diabetes (atorvastatin 20mg )

## 2018-01-14 NOTE — Telephone Encounter (Signed)
Charted in result notes. 

## 2018-01-15 ENCOUNTER — Encounter: Payer: BLUE CROSS/BLUE SHIELD | Admitting: Family Medicine

## 2018-02-18 ENCOUNTER — Other Ambulatory Visit: Payer: Self-pay

## 2018-02-18 MED ORDER — LOSARTAN POTASSIUM-HCTZ 100-25 MG PO TABS: 1.0000 | ORAL_TABLET | Freq: Every day | ORAL | 0 refills | Status: DC

## 2018-02-18 MED ORDER — AMLODIPINE BESYLATE 5 MG PO TABS: 5.0000 mg | ORAL_TABLET | Freq: Every day | ORAL | 0 refills | Status: DC

## 2018-04-01 ENCOUNTER — Other Ambulatory Visit: Payer: Self-pay | Admitting: Internal Medicine

## 2018-04-01 DIAGNOSIS — E119 Type 2 diabetes mellitus without complications: Secondary | ICD-10-CM

## 2018-04-14 ENCOUNTER — Encounter: Payer: Self-pay | Admitting: Family Medicine

## 2018-04-14 ENCOUNTER — Ambulatory Visit: Payer: BLUE CROSS/BLUE SHIELD | Admitting: Family Medicine

## 2018-04-14 VITALS — BP 140/80 | HR 76 | Temp 97.5°F | Ht 71.0 in | Wt 236.8 lb

## 2018-04-14 DIAGNOSIS — R252 Cramp and spasm: Secondary | ICD-10-CM | POA: Diagnosis not present

## 2018-04-14 DIAGNOSIS — E119 Type 2 diabetes mellitus without complications: Secondary | ICD-10-CM

## 2018-04-14 DIAGNOSIS — I10 Essential (primary) hypertension: Secondary | ICD-10-CM

## 2018-04-14 DIAGNOSIS — M7710 Lateral epicondylitis, unspecified elbow: Secondary | ICD-10-CM | POA: Insufficient documentation

## 2018-04-14 DIAGNOSIS — M7702 Medial epicondylitis, left elbow: Secondary | ICD-10-CM | POA: Diagnosis not present

## 2018-04-14 LAB — BASIC METABOLIC PANEL
BUN: 17 mg/dL (ref 6–23)
CALCIUM: 10.1 mg/dL (ref 8.4–10.5)
CO2: 31 mEq/L (ref 19–32)
Chloride: 99 mEq/L (ref 96–112)
Creatinine, Ser: 0.94 mg/dL (ref 0.40–1.50)
GFR: 90.85 mL/min (ref >60.00)
Glucose, Bld: 178 mg/dL — ABNORMAL HIGH (ref 70–99)
Potassium: 4.4 mEq/L (ref 3.5–5.1)
SODIUM: 137 meq/L (ref 135–145)

## 2018-04-14 LAB — MAGNESIUM: MAGNESIUM: 1.9 mg/dL (ref 1.5–2.5)

## 2018-04-14 LAB — HEMOGLOBIN A1C: Hgb A1c MFr Bld: 7 % — ABNORMAL HIGH (ref 4.6–6.5)

## 2018-04-14 MED ORDER — MELOXICAM 15 MG PO TABS: 15.0000 mg | ORAL_TABLET | Freq: Every day | ORAL | 0 refills | Status: DC

## 2018-04-14 NOTE — Assessment & Plan Note (Signed)
-  Rx for meloxicam -Continue use of brace.

## 2018-04-14 NOTE — Progress Notes (Signed)
Eric Sellers - 47 y.o. male MRN [id number]  Date of birth: 1970-09-22  Subjective Chief Complaint  Patient presents with  . Follow-up  . Diabetes    HPI Eric Sellers is a 47 y.o. male here today for f/u of diabetes.  -DM:  Current tx with janumet and glipizide.  Compliant with current medications.  He has been exercising regularly 4-5x/week.  He does tell me that diet and portion control continue to be a problem for him.  Blood sugars at home are typically around 120 fasting and 150-180 post prandial.  He denies symptoms of hypoglycemia.    -HTN:  Current treatment with losartan-hctz.  Reports compliance with medication.  He does report having some cramps in his legs.  Does admit that he doesn't drink enough fluids throughout the day.  He denies symptoms of hypotension, chest pain, headache, vision changes or edema.   -Elbow pain:  Recurrent pain around the L elbow, diagnosed with tennis elbow previously and this feels similar.  Has been using counterforce brace which provides some relief.   ROS:  A comprehensive ROS was completed and negative except as noted per HPI  Allergies  Allergen Reactions  . Penicillins     Past Medical History:  Diagnosis Date  . Diabetes mellitus without complication ([facility])   . History of kidney stones   . Hypertension     Past Surgical History:  Procedure Laterality Date  . TONSILLECTOMY      Social History   Socioeconomic History  . Marital status: Single    Spouse name: Not on file  . Number of children: Not on file  . Years of education: Not on file  . Highest education level: Not on file  Occupational History  . Not on file  Social Needs  . Financial resource strain: Not on file  . Food insecurity:    Worry: Not on file    Inability: Not on file  . Transportation needs:    Medical: Not on file    Non-medical: Not on file  Tobacco Use  . Smoking status: Never Smoker  . Smokeless tobacco: Never Used  Substance and Sexual  Activity  . Alcohol use: Yes    Alcohol/week: 5.0 standard drinks    Types: 5 Cans of beer per week    Comment: occasional  . Drug use: No  . Sexual activity: Yes  Lifestyle  . Physical activity:    Days per week: Not on file    Minutes per session: Not on file  . Stress: Not on file  Relationships  . Social connections:    Talks on phone: Not on file    Gets together: Not on file    Attends religious service: Not on file    Active member of club or organization: Not on file    Attends meetings of clubs or organizations: Not on file    Relationship status: Not on file  Other Topics Concern  . Not on file  Social History Narrative  . Not on file    Family History  Problem Relation Age of Onset  . Diabetes Mother   . Hypertension Mother   . Diabetes Father   . Hypertension Father   . Prostate cancer Father     Health Maintenance  Topic Date Due  . INFLUENZA VACCINE  05/17/2029 (Originally 02/04/2018)  . HEMOGLOBIN A1C  07/15/2018  . FOOT EXAM  09/15/2018  . OPHTHALMOLOGY EXAM  12/22/2018  . TETANUS/TDAP  01/13/2028  . PNEUMOCOCCAL  POLYSACCHARIDE VACCINE AGE 29-64 HIGH RISK  Completed  . HIV Screening  Completed    ----------------------------------------------------------------------------------------------------------------------------------------------------------------------------------------------------------------- Physical Exam BP 140/80   Pulse 76   Temp (!) 97.5 F (36.4 C) (Oral)   Ht 5\' 11"  (1.803 m)   Wt 236 lb 12.8 oz (107.4 kg)   SpO2 96%   BMI 33.03 kg/m   Physical Exam  Constitutional: He is oriented to person, place, and time. He appears well-nourished. No distress.  HENT:  Head: Normocephalic and atraumatic.  Mouth/Throat: Oropharynx is clear and moist.  Eyes: No scleral icterus.  Neck: Neck supple. No thyromegaly present.  Cardiovascular: Normal rate, regular rhythm and normal heart sounds.  Pulmonary/Chest: Effort normal and breath  sounds normal.  Musculoskeletal: He exhibits no edema.  Mild ttp along extensor muscles at lateral epicondyle.  Mild pain with resisted wrist extension.   Neurological: He is alert and oriented to person, place, and time.  Skin: Skin is warm and dry.  Psychiatric: He has a normal mood and affect. His behavior is normal.    ------------------------------------------------------------------------------------------------------------------------------------------------------------------------------------------------------------------- Assessment and Plan  HTN (hypertension) -BP is a little elevated here today, good readings at home though -Continue current medications -Follow low salt diet -Keep up exercise program.   DM2 (diabetes mellitus, type 2) -Encouraged to work on dietary changes and portion control -Continue exercise program -Update a1c -Continue current medications.   Medial epicondylitis -Rx for meloxicam -Continue use of brace.

## 2018-04-14 NOTE — Assessment & Plan Note (Signed)
-  Encouraged to work on dietary changes and portion control -Continue exercise program -Update a1c -Continue current medications.

## 2018-04-14 NOTE — Assessment & Plan Note (Signed)
-  BP is a little elevated here today, good readings at home though -Continue current medications -Follow low salt diet -Keep up exercise program.

## 2018-04-14 NOTE — Patient Instructions (Signed)
Nice to see you today!  Keep working on making changes to your diet and keep up regular exercise.    I will see you back in 3 months.

## 2018-04-15 NOTE — Progress Notes (Signed)
-  Diabetes control has improved with a1c of 7.0%.  He should continue current medications and exercise program.

## 2018-05-19 ENCOUNTER — Other Ambulatory Visit: Payer: Self-pay | Admitting: Family Medicine

## 2018-06-21 ENCOUNTER — Telehealth: Payer: Self-pay

## 2018-06-21 NOTE — Telephone Encounter (Signed)
Copied from CRM [phone]. Topic: General - Inquiry >> Jun 21, 2018  9:02 AM Lynne LoganHudson, Caryn D wrote: Reason for CRM: Pt called and stated [facility] and they need a prior authorization in order to cover his sitaGLIPtin-metformin (JANUMET) 50-1000 MG tablet. Provider [phone] . Pt is requesting Metformin in the interim. Please advise

## 2018-06-22 NOTE — Telephone Encounter (Signed)
Received PA Approval. 

## 2018-07-01 ENCOUNTER — Other Ambulatory Visit: Payer: Self-pay | Admitting: Family Medicine

## 2018-07-01 DIAGNOSIS — E119 Type 2 diabetes mellitus without complications: Secondary | ICD-10-CM

## 2018-07-15 ENCOUNTER — Ambulatory Visit: Payer: BLUE CROSS/BLUE SHIELD | Admitting: Family Medicine

## 2018-07-15 ENCOUNTER — Encounter: Payer: Self-pay | Admitting: Family Medicine

## 2018-07-15 ENCOUNTER — Ambulatory Visit: Payer: 59 | Admitting: Family Medicine

## 2018-07-15 VITALS — BP 152/78 | HR 68 | Temp 98.0°F | Ht 71.0 in | Wt 232.0 lb

## 2018-07-15 DIAGNOSIS — E119 Type 2 diabetes mellitus without complications: Secondary | ICD-10-CM

## 2018-07-15 DIAGNOSIS — M7712 Lateral epicondylitis, left elbow: Secondary | ICD-10-CM | POA: Diagnosis not present

## 2018-07-15 DIAGNOSIS — R6882 Decreased libido: Secondary | ICD-10-CM | POA: Diagnosis not present

## 2018-07-15 DIAGNOSIS — I10 Essential (primary) hypertension: Secondary | ICD-10-CM

## 2018-07-15 LAB — LIPID PANEL
CHOLESTEROL: 208 mg/dL — AB (ref 0–200)
HDL: 33.3 mg/dL — AB (ref >39.00)
LDL Cholesterol: 139 mg/dL — ABNORMAL HIGH (ref 0–99)
NonHDL: 174.33
TRIGLYCERIDES: 178 mg/dL — AB (ref 0.0–149.0)
Total CHOL/HDL Ratio: 6
VLDL: 35.6 mg/dL (ref 0.0–40.0)

## 2018-07-15 LAB — HEMOGLOBIN A1C: Hgb A1c MFr Bld: 7.4 % — ABNORMAL HIGH (ref 4.6–6.5)

## 2018-07-15 LAB — TESTOSTERONE: Testosterone: 366.2 ng/dL (ref 300.00–890.00)

## 2018-07-15 MED ORDER — NITROGLYCERIN 0.4 MG/HR TD PT24: MEDICATED_PATCH | TRANSDERMAL | 0 refills | Status: DC

## 2018-07-15 MED ORDER — OLMESARTAN MEDOXOMIL-HCTZ 40-25 MG PO TABS: 1.0000 | ORAL_TABLET | Freq: Every day | ORAL | 1 refills | Status: DC

## 2018-07-15 NOTE — Assessment & Plan Note (Signed)
Check testosterone levels today.  

## 2018-07-15 NOTE — Assessment & Plan Note (Signed)
-  BP elevated -Change losartan and hctz to olmesartan-hctz -Follow low salt diet.

## 2018-07-15 NOTE — Progress Notes (Signed)
Berna BueHarry Chenevert - 48 y.o. male MRN [id number]  Date of birth: 05/11/1971  Subjective Chief Complaint  Patient presents with  . Follow-up    denies health concerns-has been trying to lose weight    HPI Berna BueHarry Cosner is a 48 y.o. male with history of HTN and T2DM here today for follow up of HTN and DM.  -T2DM:  Current treatment with glipizide and janumet.  Previous a1c of 7.0%.  He has been exercising regularly at [employer] and weight is down an additional 4 lbs since last visit.  He denies symptoms of hypoglycemia.    -HTN:  Current treatment with losartan, hctz and amlodipine.  Would prefer to take a combination pill again as his losartan-hctz had to be split up due to recall.  He also reports some decreased libido and would like testosterone checked.  He denies chest pain, shortness of breath, palpitations, headache or vision changes  -Tennis elbow:  L tennis elbow that flares up from time to time while working out.  Has never really resolved over the past few months.  He denies swelling or change in grip strength.  He has tried NSAIDS without much improvement.   ROS:  A comprehensive ROS was completed and negative except as noted per HPI  Allergies  Allergen Reactions  . Penicillins     Past Medical History:  Diagnosis Date  . Diabetes mellitus without complication ([facility])   . History of kidney stones   . Hypertension     Past Surgical History:  Procedure Laterality Date  . TONSILLECTOMY      Social History   Socioeconomic History  . Marital status: Single    Spouse name: Not on file  . Number of children: Not on file  . Years of education: Not on file  . Highest education level: Not on file  Occupational History  . Not on file  Social Needs  . Financial resource strain: Not on file  . Food insecurity:    Worry: Not on file    Inability: Not on file  . Transportation needs:    Medical: Not on file    Non-medical: Not on file  Tobacco Use  .  Smoking status: Never Smoker  . Smokeless tobacco: Never Used  Substance and Sexual Activity  . Alcohol use: Yes    Alcohol/week: 5.0 standard drinks    Types: 5 Cans of beer per week    Comment: occasional  . Drug use: No  . Sexual activity: Yes  Lifestyle  . Physical activity:    Days per week: Not on file    Minutes per session: Not on file  . Stress: Not on file  Relationships  . Social connections:    Talks on phone: Not on file    Gets together: Not on file    Attends religious service: Not on file    Active member of club or organization: Not on file    Attends meetings of clubs or organizations: Not on file    Relationship status: Not on file  Other Topics Concern  . Not on file  Social History Narrative  . Not on file    Family History  Problem Relation Age of Onset  . Diabetes Mother   . Hypertension Mother   . Diabetes Father   . Hypertension Father   . Prostate cancer Father     Health Maintenance  Topic Date Due  . INFLUENZA VACCINE  05/17/2029 (Originally 02/04/2018)  . FOOT EXAM  09/15/2018  . HEMOGLOBIN A1C  10/14/2018  . OPHTHALMOLOGY EXAM  12/22/2018  . TETANUS/TDAP  01/13/2028  . PNEUMOCOCCAL POLYSACCHARIDE VACCINE AGE 60-64 HIGH RISK  Completed  . HIV Screening  Completed    ----------------------------------------------------------------------------------------------------------------------------------------------------------------------------------------------------------------- Physical Exam BP (!) 152/78   Pulse 68   Temp 98 F (36.7 C) (Oral)   Ht 5\' 11"  (1.803 m)   Wt 232 lb (105.2 kg)   SpO2 98%   BMI 32.36 kg/m   Physical Exam Constitutional:      Appearance: Normal appearance.  HENT:     Head: Normocephalic and atraumatic.     Mouth/Throat:     Mouth: Mucous membranes are dry.  Eyes:     General: No scleral icterus. Cardiovascular:     Rate and Rhythm: Normal rate and regular rhythm.  Pulmonary:     Effort: Pulmonary  effort is normal.     Breath sounds: Normal breath sounds.  Musculoskeletal:     Comments: TTP along lateral epicondyle and extensor group muscles.   Skin:    General: Skin is warm and dry.     Findings: No rash.  Neurological:     General: No focal deficit present.     Mental Status: He is alert.  Psychiatric:        Mood and Affect: Mood normal.        Behavior: Behavior normal.     ------------------------------------------------------------------------------------------------------------------------------------------------------------------------------------------------------------------- Assessment and Plan  DM2 (diabetes mellitus, type 2) -He has been doing well with increased activity, encouraged to continue -Update a1c and lipid panel today.   HTN (hypertension) -BP elevated -Change losartan and hctz to olmesartan-hctz -Follow low salt diet.   Lateral epicondylitis -trial of nitroglycerin patches, reviewed side effects -will refer to sports med if not improving.   Decreased libido -Check testosterone levels today.

## 2018-07-15 NOTE — Patient Instructions (Signed)
DASH Eating Plan  DASH stands for "Dietary Approaches to Stop Hypertension." The DASH eating plan is a healthy eating plan that has been shown to reduce high blood pressure (hypertension). It may also reduce your risk for type 2 diabetes, heart disease, and stroke. The DASH eating plan may also help with weight loss.  What are tips for following this plan?    General guidelines   Avoid eating more than 2,300 mg (milligrams) of salt (sodium) a day. If you have hypertension, you may need to reduce your sodium intake to 1,500 mg a day.   Limit alcohol intake to no more than 1 drink a day for nonpregnant women and 2 drinks a day for men. One drink equals 12 oz of beer, 5 oz of wine, or 1 oz of hard liquor.   Work with your health care provider to maintain a healthy body weight or to lose weight. Ask what an ideal weight is for you.   Get at least 30 minutes of exercise that causes your heart to beat faster (aerobic exercise) most days of the week. Activities may include walking, swimming, or biking.   Work with your health care provider or diet and nutrition specialist (dietitian) to adjust your eating plan to your individual calorie needs.  Reading food labels     Check food labels for the amount of sodium per serving. Choose foods with less than 5 percent of the Daily Value of sodium. Generally, foods with less than 300 mg of sodium per serving fit into this eating plan.   To find whole grains, look for the word "whole" as the first word in the ingredient list.  Shopping   Buy products labeled as "low-sodium" or "no salt added."   Buy fresh foods. Avoid canned foods and premade or frozen meals.  Cooking   Avoid adding salt when cooking. Use salt-free seasonings or herbs instead of table salt or sea salt. Check with your health care provider or pharmacist before using salt substitutes.   Do not fry foods. Cook foods using healthy methods such as baking, boiling, grilling, and broiling instead.   Cook with  heart-healthy oils, such as olive, canola, soybean, or sunflower oil.  Meal planning   Eat a balanced diet that includes:  ? 5 or more servings of fruits and vegetables each day. At each meal, try to fill half of your plate with fruits and vegetables.  ? Up to 6-8 servings of whole grains each day.  ? Less than 6 oz of lean meat, poultry, or fish each day. A 3-oz serving of meat is about the same size as a deck of cards. One egg equals 1 oz.  ? 2 servings of low-fat dairy each day.  ? A serving of nuts, seeds, or beans 5 times each week.  ? Heart-healthy fats. Healthy fats called Omega-3 fatty acids are found in foods such as flaxseeds and coldwater fish, like sardines, salmon, and mackerel.   Limit how much you eat of the following:  ? Canned or prepackaged foods.  ? Food that is high in trans fat, such as fried foods.  ? Food that is high in saturated fat, such as fatty meat.  ? Sweets, desserts, sugary drinks, and other foods with added sugar.  ? Full-fat dairy products.   Do not salt foods before eating.   Try to eat at least 2 vegetarian meals each week.   Eat more home-cooked food and less restaurant, buffet, and fast food.     When eating at a restaurant, ask that your food be prepared with less salt or no salt, if possible.  What foods are recommended?  The items listed may not be a complete list. Talk with your dietitian about what dietary choices are best for you.  Grains  Whole-grain or whole-wheat bread. Whole-grain or whole-wheat pasta. Brown rice. Oatmeal. Quinoa. Bulgur. Whole-grain and low-sodium cereals. Pita bread. Low-fat, low-sodium crackers. Whole-wheat flour tortillas.  Vegetables  Fresh or frozen vegetables (raw, steamed, roasted, or grilled). Low-sodium or reduced-sodium tomato and vegetable juice. Low-sodium or reduced-sodium tomato sauce and tomato paste. Low-sodium or reduced-sodium canned vegetables.  Fruits  All fresh, dried, or frozen fruit. Canned fruit in natural juice (without  added sugar).  Meat and other protein foods  Skinless chicken or turkey. Ground chicken or turkey. Pork with fat trimmed off. Fish and seafood. Egg whites. Dried beans, peas, or lentils. Unsalted nuts, nut butters, and seeds. Unsalted canned beans. Lean cuts of beef with fat trimmed off. Low-sodium, lean deli meat.  Dairy  Low-fat (1%) or fat-free (skim) milk. Fat-free, low-fat, or reduced-fat cheeses. Nonfat, low-sodium ricotta or cottage cheese. Low-fat or nonfat yogurt. Low-fat, low-sodium cheese.  Fats and oils  Soft margarine without trans fats. Vegetable oil. Low-fat, reduced-fat, or light mayonnaise and salad dressings (reduced-sodium). Canola, safflower, olive, soybean, and sunflower oils. Avocado.  Seasoning and other foods  Herbs. Spices. Seasoning mixes without salt. Unsalted popcorn and pretzels. Fat-free sweets.  What foods are not recommended?  The items listed may not be a complete list. Talk with your dietitian about what dietary choices are best for you.  Grains  Baked goods made with fat, such as croissants, muffins, or some breads. Dry pasta or rice meal packs.  Vegetables  Creamed or fried vegetables. Vegetables in a cheese sauce. Regular canned vegetables (not low-sodium or reduced-sodium). Regular canned tomato sauce and paste (not low-sodium or reduced-sodium). Regular tomato and vegetable juice (not low-sodium or reduced-sodium). Pickles. Olives.  Fruits  Canned fruit in a light or heavy syrup. Fried fruit. Fruit in cream or butter sauce.  Meat and other protein foods  Fatty cuts of meat. Ribs. Fried meat. Bacon. Sausage. Bologna and other processed lunch meats. Salami. Fatback. Hotdogs. Bratwurst. Salted nuts and seeds. Canned beans with added salt. Canned or smoked fish. Whole eggs or egg yolks. Chicken or turkey with skin.  Dairy  Whole or 2% milk, cream, and half-and-half. Whole or full-fat cream cheese. Whole-fat or sweetened yogurt. Full-fat cheese. Nondairy creamers. Whipped toppings.  Processed cheese and cheese spreads.  Fats and oils  Butter. Stick margarine. Lard. Shortening. Ghee. Bacon fat. Tropical oils, such as coconut, palm kernel, or palm oil.  Seasoning and other foods  Salted popcorn and pretzels. Onion salt, garlic salt, seasoned salt, table salt, and sea salt. Worcestershire sauce. Tartar sauce. Barbecue sauce. Teriyaki sauce. Soy sauce, including reduced-sodium. Steak sauce. Canned and packaged gravies. Fish sauce. Oyster sauce. Cocktail sauce. Horseradish that you find on the shelf. Ketchup. Mustard. Meat flavorings and tenderizers. Bouillon cubes. Hot sauce and Tabasco sauce. Premade or packaged marinades. Premade or packaged taco seasonings. Relishes. Regular salad dressings.  Where to find more information:   National Heart, Lung, and Blood Institute: www.nhlbi.nih.gov   American Heart Association: www.heart.org  Summary   The DASH eating plan is a healthy eating plan that has been shown to reduce high blood pressure (hypertension). It may also reduce your risk for type 2 diabetes, heart disease, and stroke.   With the   DASH eating plan, you should limit salt (sodium) intake to 2,300 mg a day. If you have hypertension, you may need to reduce your sodium intake to 1,500 mg a day.   When on the DASH eating plan, aim to eat more fresh fruits and vegetables, whole grains, lean proteins, low-fat dairy, and heart-healthy fats.   Work with your health care provider or diet and nutrition specialist (dietitian) to adjust your eating plan to your individual calorie needs.  This information is not intended to replace advice given to you by your health care provider. Make sure you discuss any questions you have with your health care provider.  Document Released: 06/12/2011 Document Revised: 06/16/2016 Document Reviewed: 06/16/2016  Elsevier Interactive Patient Education  2019 [employer]vier Inc[employer]

## 2018-07-15 NOTE — Assessment & Plan Note (Signed)
-  trial of nitroglycerin patches, reviewed side effects -will refer to sports med if not improving.

## 2018-07-15 NOTE — Assessment & Plan Note (Signed)
-  He has been doing well with increased activity, encouraged to continue -Update a1c and lipid panel today.

## 2018-07-20 NOTE — Progress Notes (Signed)
-  Cholesterol is higher than previous check.  Recommend addition of atorvastatin to help reduce his cardiac risk.   -A1c is higher than previous reading.  Recommend working on dietary changes and portion control.  Continue exercise regimen.

## 2018-08-02 ENCOUNTER — Other Ambulatory Visit: Payer: Self-pay | Admitting: Family Medicine

## 2018-08-02 DIAGNOSIS — E119 Type 2 diabetes mellitus without complications: Secondary | ICD-10-CM

## 2018-08-18 ENCOUNTER — Other Ambulatory Visit: Payer: Self-pay | Admitting: Family Medicine

## 2018-09-01 ENCOUNTER — Other Ambulatory Visit: Payer: Self-pay | Admitting: Family Medicine

## 2018-09-01 DIAGNOSIS — E119 Type 2 diabetes mellitus without complications: Secondary | ICD-10-CM

## 2018-09-18 ENCOUNTER — Other Ambulatory Visit: Payer: Self-pay | Admitting: Family Medicine

## 2018-09-18 DIAGNOSIS — E119 Type 2 diabetes mellitus without complications: Secondary | ICD-10-CM

## 2018-10-14 ENCOUNTER — Encounter: Payer: Self-pay | Admitting: Family Medicine

## 2018-10-14 ENCOUNTER — Ambulatory Visit (INDEPENDENT_AMBULATORY_CARE_PROVIDER_SITE_OTHER): Payer: 59 | Admitting: Family Medicine

## 2018-10-14 VITALS — BP 136/88 | HR 79 | Wt 227.0 lb

## 2018-10-14 DIAGNOSIS — I10 Essential (primary) hypertension: Secondary | ICD-10-CM | POA: Diagnosis not present

## 2018-10-14 DIAGNOSIS — E119 Type 2 diabetes mellitus without complications: Secondary | ICD-10-CM | POA: Diagnosis not present

## 2018-10-14 NOTE — Assessment & Plan Note (Signed)
-  BP is well controlled with current medication, will plan to continue.

## 2018-10-14 NOTE — Progress Notes (Signed)
Eric Sellers - 48 y.o. male MRN [id number]  Date of birth: 07-30-1970   This visit type was conducted due to national recommendations for restrictions regarding the COVID-19 Pandemic (e.g. social distancing).  This format is felt to be most appropriate for this patient at this time.  All issues noted in this document were discussed and addressed.  No physical exam was performed (except for noted visual exam findings with Video Visits).  I discussed the limitations of evaluation and management by telemedicine and the availability of in person appointments. The patient expressed understanding and agreed to proceed.  I connected with@ on 10/14/18 at 10:00 AM EDT by a video enabled telemedicine application and verified that I am speaking with the correct person using two identifiers.   Patient Location: Home [address] [place] [address]   Provider location:   [facility]  Chief Complaint  Patient presents with  . Follow-up    Webex , HTN/DM, meds good    HPI  Eric Sellers is a 48 y.o. male who presents via audio/video conferencing for a telehealth visit today.  He is following up today for HTN and DM.  He reports he is doing well with current medications.  BP is well controlled.  He did have some dizziness with positional change when exercising after initially starting benicar-hct but now is doing much better.  He reports that blood sugars have been a little higher recently (140's) as the gym he was going to each day has closed due to coronavirus pandemic.  He finds if difficult to get motivated at home to exercise.    ROS:  A comprehensive ROS was completed and negative except as noted per HPI  Past Medical History:  Diagnosis Date  . Diabetes mellitus without complication ([place])   . History of kidney stones   . Hypertension     Past Surgical History:  Procedure Laterality Date  . TONSILLECTOMY      Family History  Problem Relation Age of Onset  . Diabetes Mother    . Hypertension Mother   . Diabetes Father   . Hypertension Father   . Prostate cancer Father     Social History   Socioeconomic History  . Marital status: Single    Spouse name: Not on file  . Number of children: Not on file  . Years of education: Not on file  . Highest education level: Not on file  Occupational History  . Not on file  Social Needs  . Financial resource strain: Not on file  . Food insecurity:    Worry: Not on file    Inability: Not on file  . Transportation needs:    Medical: Not on file    Non-medical: Not on file  Tobacco Use  . Smoking status: Never Smoker  . Smokeless tobacco: Never Used  Substance and Sexual Activity  . Alcohol use: Yes    Alcohol/week: 5.0 standard drinks    Types: 5 Cans of beer per week    Comment: occasional  . Drug use: No  . Sexual activity: Yes  Lifestyle  . Physical activity:    Days per week: Not on file    Minutes per session: Not on file  . Stress: Not on file  Relationships  . Social connections:    Talks on phone: Not on file    Gets together: Not on file    Attends religious service: Not on file    Active member of club or organization: Not  on file    Attends meetings of clubs or organizations: Not on file    Relationship status: Not on file  . Intimate partner violence:    Fear of current or ex partner: Not on file    Emotionally abused: Not on file    Physically abused: Not on file    Forced sexual activity: Not on file  Other Topics Concern  . Not on file  Social History Narrative  . Not on file     Current Outpatient Medications:  .  Blood Glucose Monitoring Suppl (CONTOUR BLOOD GLUCOSE SYSTEM) w/Device KIT, Use to check glucose TID., Disp: 1 each, Rfl: 0 .  glipiZIDE (GLUCOTROL) 10 MG tablet, TAKE 1 TABLET BY MOUTH 2 TIMES DAILY BEFORE A MEAL., Disp: 60 tablet, Rfl: 2 .  glucose blood (ONE TOUCH ULTRA TEST) test strip, 1 each by Other route 2 (two) times daily as needed for other., Disp: 100  each, Rfl: 5 .  glucose blood test strip, Use to check glucose TID.  Please dispense appropriate strips for meter dispensed., Disp: 100 each, Rfl: 12 .  meloxicam (MOBIC) 15 MG tablet, Take 1 tablet (15 mg total) by mouth daily., Disp: 30 tablet, Rfl: 0 .  nitroGLYCERIN (NITRODUR - DOSED IN MG/24 HR) 0.4 mg/hr patch, Place 1/4 patch over painful area on forearm.  Change daily. Discontinue for headache or dizziness., Disp: 30 patch, Rfl: 0 .  olmesartan-hydrochlorothiazide (BENICAR HCT) 40-25 MG tablet, Take 1 tablet by mouth daily., Disp: 90 tablet, Rfl: 1 .  ONETOUCH DELICA LANCETS FINE MISC, Inject 1 each into the skin 2 (two) times daily as needed., Disp: 100 each, Rfl: 5 .  sitaGLIPtin-metformin (JANUMET) 50-1000 MG tablet, TAKE 1 TABLET BY MOUTH 2 TIMES DAILY WITH A MEAL., Disp: 60 tablet, Rfl: 6 .  amLODipine (NORVASC) 5 MG tablet, TAKE 1 TABLET BY MOUTH DAILY. (Patient not taking: Reported on 10/14/2018), Disp: 90 tablet, Rfl: 1  EXAM:  VITALS per patient if applicable: BP 762/26 Comment: Pt has personal b.p. cuff took at hm  Pulse 79 Comment: pt provided with BP reading  Wt 227 lb (103 kg) Comment: Pt weighted at home  BMI 31.66 kg/m   GENERAL: alert, oriented, appears well and in no acute distress  HEENT: atraumatic, conjunttiva clear, no obvious abnormalities on inspection of external nose and ears  NECK: normal movements of the head and neck  LUNGS: on inspection no signs of respiratory distress, breathing rate appears normal, no obvious gross SOB  CV: no obvious cyanosis  MS: moves all visible extremities without noticeable abnormality  PSYCH/NEURO: pleasant and cooperative, no obvious depression or anxiety, speech and thought processing grossly intact  ASSESSMENT AND PLAN:  Discussed the following assessment and plan:  DM2 (diabetes mellitus, type 2) -Glucose control has worsened some due to not exercising  -Encouraged to find things around the house to keep him  active -Continue current medications -Follow healthy diet -Return in 2-3 months for in office visit, will plan to update labs at that time.   HTN (hypertension) -BP is well controlled with current medication, will plan to continue.         I discussed the assessment and treatment plan with the patient. The patient was provided an opportunity to ask questions and all were answered. The patient agreed with the plan and demonstrated an understanding of the instructions.   The patient was advised to call back or seek an in-person evaluation if the symptoms worsen or if the condition  fails to improve as anticipated.     Luetta Nutting, DO

## 2018-10-14 NOTE — Assessment & Plan Note (Signed)
-  Glucose control has worsened some due to not exercising  -Encouraged to find things around the house to keep him active -Continue current medications -Follow healthy diet -Return in 2-3 months for in office visit, will plan to update labs at that time.

## 2018-10-18 ENCOUNTER — Other Ambulatory Visit: Payer: Self-pay | Admitting: Family Medicine

## 2018-10-18 DIAGNOSIS — I10 Essential (primary) hypertension: Secondary | ICD-10-CM

## 2018-10-18 DIAGNOSIS — E119 Type 2 diabetes mellitus without complications: Secondary | ICD-10-CM

## 2018-10-18 MED ORDER — AMLODIPINE BESYLATE 5 MG PO TABS: 5.0000 mg | ORAL_TABLET | Freq: Every day | ORAL | 2 refills | Status: DC

## 2018-10-18 MED ORDER — SITAGLIPTIN PHOS-METFORMIN HCL 50-1000 MG PO TABS: ORAL_TABLET | ORAL | 2 refills | Status: DC

## 2018-10-18 MED ORDER — GLIPIZIDE 10 MG PO TABS: ORAL_TABLET | ORAL | 2 refills | Status: DC

## 2018-10-18 MED ORDER — OLMESARTAN MEDOXOMIL-HCTZ 40-25 MG PO TABS: 1.0000 | ORAL_TABLET | Freq: Every day | ORAL | 1 refills | Status: DC

## 2019-01-18 ENCOUNTER — Telehealth: Payer: Self-pay

## 2019-01-18 NOTE — Telephone Encounter (Signed)
Questions for Screening COVID-19  Symptom onset: none, Pt is ware to call on arrival, #[id number] was repeated back .   Travel or Contacts: none During this illness, did/does the patient experience any of the following symptoms? Fever >100.70F []   Yes [x]   No []   Unknown Subjective fever (felt feverish) []   Yes [x]   No []   Unknown Chills []   Yes [x]   No []   Unknown Muscle aches (myalgia) []   Yes [x]   No []   Unknown Runny nose (rhinorrhea) []   Yes [x]   No []   Unknown Sore throat []   Yes [x]   No []   Unknown Cough (new onset or worsening of chronic cough) []   Yes [x]   No []   Unknown Shortness of breath (dyspnea) []   Yes [x]   No []   Unknown Nausea or vomiting []   Yes [x]   No []   Unknown Headache []   Yes [x]   No []   Unknown Abdominal pain  []   Yes []   No []   Unknown Diarrhea (?3 loose/looser than normal stools/24hr period) []   Yes [x]   No []   Unknown Other, specify:  Patient risk factors: Smoker? []   Current []   Former [x]   Never If male, currently pregnant? []   Yes [x]   No  Patient Active Problem List   Diagnosis Date Noted  . Decreased libido 07/15/2018  . Lateral epicondylitis 04/14/2018  . Well adult exam 01/12/2018  . HLD (hyperlipidemia) 03/08/2014  . HTN (hypertension) 08/31/2013  . DM2 (diabetes mellitus, type 2) ([place]) 08/31/2013    Plan:  []   High risk for COVID-19 with red flags go to ED (with CP, SOB, weak/lightheaded, or fever > 101.5). Call ahead.  []   High risk for COVID-19 but stable. Inform provider and coordinate time for [facility] visit.   [x]   No red flags but URI signs or symptoms okay for [facility] visit.

## 2019-01-19 ENCOUNTER — Other Ambulatory Visit: Payer: Self-pay

## 2019-01-19 ENCOUNTER — Ambulatory Visit (INDEPENDENT_AMBULATORY_CARE_PROVIDER_SITE_OTHER): Payer: 59

## 2019-01-19 ENCOUNTER — Encounter: Payer: Self-pay | Admitting: Family Medicine

## 2019-01-19 ENCOUNTER — Ambulatory Visit: Payer: 59 | Admitting: Family Medicine

## 2019-01-19 VITALS — BP 118/76 | HR 94 | Temp 97.9°F | Resp 18 | Ht 71.0 in | Wt 229.8 lb

## 2019-01-19 DIAGNOSIS — M25522 Pain in left elbow: Secondary | ICD-10-CM | POA: Diagnosis not present

## 2019-01-19 DIAGNOSIS — E7800 Pure hypercholesterolemia, unspecified: Secondary | ICD-10-CM

## 2019-01-19 DIAGNOSIS — M545 Low back pain, unspecified: Secondary | ICD-10-CM

## 2019-01-19 DIAGNOSIS — I10 Essential (primary) hypertension: Secondary | ICD-10-CM | POA: Diagnosis not present

## 2019-01-19 DIAGNOSIS — E119 Type 2 diabetes mellitus without complications: Secondary | ICD-10-CM

## 2019-01-19 LAB — POCT URINALYSIS DIPSTICK
Bilirubin, UA: NEGATIVE
Blood, UA: NEGATIVE
Glucose, UA: NEGATIVE
Ketones, UA: POSITIVE
Leukocytes, UA: NEGATIVE
Nitrite, UA: NEGATIVE
Protein, UA: NEGATIVE
Spec Grav, UA: 1.015 (ref 1.010–1.025)
Urobilinogen, UA: 0.2 E.U./dL
pH, UA: 5.5 (ref 5.0–8.0)

## 2019-01-19 NOTE — Assessment & Plan Note (Signed)
Xrays of lumbar spine and L elbow ordered.

## 2019-01-19 NOTE — Patient Instructions (Signed)
Diabetes Mellitus and Exercise Exercising regularly is important for your overall health, especially when you have diabetes (diabetes mellitus). Exercising is not only about losing weight. It has many other health benefits, such as increasing muscle strength and bone density and reducing body fat and stress. This leads to improved fitness, flexibility, and endurance, all of which result in better overall health. Exercise has additional benefits for people with diabetes, including:  Reducing appetite.  Helping to lower and control blood glucose.  Lowering blood pressure.  Helping to control amounts of fatty substances (lipids) in the blood, such as cholesterol and triglycerides.  Helping the body to respond better to insulin (improving insulin sensitivity).  Reducing how much insulin the body needs.  Decreasing the risk for heart disease by: ? Lowering cholesterol and triglyceride levels. ? Increasing the levels of good cholesterol. ? Lowering blood glucose levels. What is my activity plan? Your health care provider or certified diabetes educator can help you make a plan for the type and frequency of exercise (activity plan) that works for you. Make sure that you:  Do at least 150 minutes of moderate-intensity or vigorous-intensity exercise each week. This could be brisk walking, biking, or water aerobics. ? Do stretching and strength exercises, such as yoga or weightlifting, at least 2 times a week. ? Spread out your activity over at least 3 days of the week.  Get some form of physical activity every day. ? Do not go more than 2 days in a row without some kind of physical activity. ? Avoid being inactive for more than 30 minutes at a time. Take frequent breaks to walk or stretch.  Choose a type of exercise or activity that you enjoy, and set realistic goals.  Start slowly, and gradually increase the intensity of your exercise over time. What do I need to know about managing my  diabetes?   Check your blood glucose before and after exercising. ? If your blood glucose is 240 mg/dL (13.3 mmol/L) or higher before you exercise, check your urine for ketones. If you have ketones in your urine, do not exercise until your blood glucose returns to normal. ? If your blood glucose is 100 mg/dL (5.6 mmol/L) or lower, eat a snack containing 15-20 grams of carbohydrate. Check your blood glucose 15 minutes after the snack to make sure that your level is above 100 mg/dL (5.6 mmol/L) before you start your exercise.  Know the symptoms of low blood glucose (hypoglycemia) and how to treat it. Your risk for hypoglycemia increases during and after exercise. Common symptoms of hypoglycemia can include: ? Hunger. ? Anxiety. ? Sweating and feeling clammy. ? Confusion. ? Dizziness or feeling light-headed. ? Increased heart rate or palpitations. ? Blurry vision. ? Tingling or numbness around the mouth, lips, or tongue. ? Tremors or shakes. ? Irritability.  Keep a rapid-acting carbohydrate snack available before, during, and after exercise to help prevent or treat hypoglycemia.  Avoid injecting insulin into areas of the body that are going to be exercised. For example, avoid injecting insulin into: ? The arms, when playing tennis. ? The legs, when jogging.  Keep records of your exercise habits. Doing this can help you and your health care provider adjust your diabetes management plan as needed. Write down: ? Food that you eat before and after you exercise. ? Blood glucose levels before and after you exercise. ? The type and amount of exercise you have done. ? When your insulin is expected to peak, if you use   insulin. Avoid exercising at times when your insulin is peaking.  When you start a new exercise or activity, work with your health care provider to make sure the activity is safe for you, and to adjust your insulin, medicines, or food intake as needed.  Drink plenty of water while  you exercise to prevent dehydration or heat stroke. Drink enough fluid to keep your urine clear or pale yellow. Summary  Exercising regularly is important for your overall health, especially when you have diabetes (diabetes mellitus).  Exercising has many health benefits, such as increasing muscle strength and bone density and reducing body fat and stress.  Your health care provider or certified diabetes educator can help you make a plan for the type and frequency of exercise (activity plan) that works for you.  When you start a new exercise or activity, work with your health care provider to make sure the activity is safe for you, and to adjust your insulin, medicines, or food intake as needed. This information is not intended to replace advice given to you by your health care provider. Make sure you discuss any questions you have with your health care provider. Document Released: 09/13/2003 Document Revised: 01/15/2017 Document Reviewed: 12/03/2015 Elsevier Patient Education  2020 [employer] Inc.  
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
## 2019-01-19 NOTE — Assessment & Plan Note (Signed)
-  Update a1c -Encouraged to monitor home blood sugars -Encouraged to restart exercise, discussed outdoor classes available through [facility] or walking.

## 2019-01-19 NOTE — Assessment & Plan Note (Signed)
Update lipid panel.  

## 2019-01-19 NOTE — Assessment & Plan Note (Signed)
-  BP well controlled, continue benicar-hct and amlodipine at current dose.

## 2019-01-20 LAB — COMPREHENSIVE METABOLIC PANEL
AG Ratio: 1.6 (calc) (ref 1.0–2.5)
ALT: 51 U/L — ABNORMAL HIGH (ref 9–46)
AST: 35 U/L (ref 10–40)
Albumin: 4.7 g/dL (ref 3.6–5.1)
Alkaline phosphatase (APISO): 74 U/L (ref 36–130)
BUN: 20 mg/dL (ref 7–25)
CO2: 27 mmol/L (ref 20–32)
Calcium: 9.9 mg/dL (ref 8.6–10.3)
Chloride: 96 mmol/L — ABNORMAL LOW (ref 98–110)
Creat: 1.1 mg/dL (ref 0.60–1.35)
Globulin: 3 g/dL (calc) (ref 1.9–3.7)
Glucose, Bld: 187 mg/dL — ABNORMAL HIGH (ref 65–99)
Potassium: 4.3 mmol/L (ref 3.5–5.3)
Sodium: 135 mmol/L (ref 135–146)
Total Bilirubin: 1.2 mg/dL (ref 0.2–1.2)
Total Protein: 7.7 g/dL (ref 6.1–8.1)

## 2019-01-20 LAB — LIPID PANEL
Cholesterol: 233 mg/dL — ABNORMAL HIGH (ref <200)
HDL: 34 mg/dL — ABNORMAL LOW (ref >=40)
LDL Cholesterol (Calc): 138 mg/dL (calc) — ABNORMAL HIGH
Non-HDL Cholesterol (Calc): 199 mg/dL (calc) — ABNORMAL HIGH (ref <130)
Total CHOL/HDL Ratio: 6.9 (calc) — ABNORMAL HIGH (ref <5.0)
Triglycerides: 399 mg/dL — ABNORMAL HIGH (ref <150)

## 2019-01-20 LAB — HEMOGLOBIN A1C
Hgb A1c MFr Bld: 7.6 % of total Hgb — ABNORMAL HIGH (ref <5.7)
Mean Plasma Glucose: 171 (calc)
eAG (mmol/L): 9.5 (calc)

## 2019-05-03 ENCOUNTER — Other Ambulatory Visit: Payer: Self-pay | Admitting: Family Medicine

## 2019-05-03 DIAGNOSIS — I10 Essential (primary) hypertension: Secondary | ICD-10-CM

## 2019-05-03 DIAGNOSIS — E119 Type 2 diabetes mellitus without complications: Secondary | ICD-10-CM

## 2019-05-05 ENCOUNTER — Other Ambulatory Visit: Payer: Self-pay | Admitting: Family Medicine

## 2019-06-19 ENCOUNTER — Encounter: Payer: Self-pay | Admitting: Family Medicine

## 2019-08-29 ENCOUNTER — Ambulatory Visit: Payer: 59 | Admitting: Family Medicine

## 2019-09-05 ENCOUNTER — Encounter: Payer: Self-pay | Admitting: Family Medicine

## 2019-09-05 ENCOUNTER — Ambulatory Visit (INDEPENDENT_AMBULATORY_CARE_PROVIDER_SITE_OTHER): Payer: 59 | Admitting: Family Medicine

## 2019-09-05 ENCOUNTER — Other Ambulatory Visit: Payer: Self-pay

## 2019-09-05 VITALS — BP 134/79 | HR 90 | Temp 97.9°F | Ht 71.0 in | Wt 237.6 lb

## 2019-09-05 DIAGNOSIS — I10 Essential (primary) hypertension: Secondary | ICD-10-CM | POA: Diagnosis not present

## 2019-09-05 DIAGNOSIS — Z0000 Encounter for general adult medical examination without abnormal findings: Secondary | ICD-10-CM | POA: Diagnosis not present

## 2019-09-05 DIAGNOSIS — E119 Type 2 diabetes mellitus without complications: Secondary | ICD-10-CM | POA: Diagnosis not present

## 2019-09-05 DIAGNOSIS — E7800 Pure hypercholesterolemia, unspecified: Secondary | ICD-10-CM

## 2019-09-05 NOTE — Progress Notes (Signed)
Established Patient Office Visit  Subjective:  Patient ID: Eric Sellers, male    DOB: November 03, 1970  Age: 49 y.o. MRN: [id number]  CC:  Chief Complaint  Patient presents with  . Transitions Of Care    toc from Dr. Zigmund Daniel, no concerns.    HPI Eric Sellers presents for follow-up of his hypertension, diabetes hyperlipidemia.  Diabetes has been under fair control with his current regimen but he has gained some weight with the pandemic and has not been exercising as much.  Blood pressures well controlled with the olmesartan HCTZ and amlodipine.  No issues taking these medications.  Lipids have not been treated yet.  He needs an eye check.  He is 6 hours fasting.  Past Medical History:  Diagnosis Date  . Diabetes mellitus without complication ([place])   . History of kidney stones   . Hypertension     Past Surgical History:  Procedure Laterality Date  . TONSILLECTOMY      Family History  Problem Relation Age of Onset  . Diabetes Mother   . Hypertension Mother   . Diabetes Father   . Hypertension Father   . Prostate cancer Father     Social History   Socioeconomic History  . Marital status: Single    Spouse name: Not on file  . Number of children: Not on file  . Years of education: Not on file  . Highest education level: Not on file  Occupational History  . Not on file  Tobacco Use  . Smoking status: Never Smoker  . Smokeless tobacco: Never Used  Substance and Sexual Activity  . Alcohol use: Yes    Alcohol/week: 5.0 standard drinks    Types: 5 Cans of beer per week    Comment: occasional  . Drug use: No  . Sexual activity: Yes  Other Topics Concern  . Not on file  Social History Narrative  . Not on file   Social Determinants of Health   Financial Resource Strain:   . Difficulty of Paying Living Expenses: Not on file  Food Insecurity:   . Worried About Charity fundraiser in the Last Year: Not on file  . Ran Out of Food in the Last Year: Not on file   Transportation Needs:   . Lack of Transportation (Medical): Not on file  . Lack of Transportation (Non-Medical): Not on file  Physical Activity:   . Days of Exercise per Week: Not on file  . Minutes of Exercise per Session: Not on file  Stress:   . Feeling of Stress : Not on file  Social Connections:   . Frequency of Communication with Friends and Family: Not on file  . Frequency of Social Gatherings with Friends and Family: Not on file  . Attends Religious Services: Not on file  . Active Member of Clubs or Organizations: Not on file  . Attends Archivist Meetings: Not on file  . Marital Status: Not on file  Intimate Partner Violence:   . Fear of Current or Ex-Partner: Not on file  . Emotionally Abused: Not on file  . Physically Abused: Not on file  . Sexually Abused: Not on file    Outpatient Medications Prior to Visit  Medication Sig Dispense Refill  . amLODipine (NORVASC) 5 MG tablet TAKE 1 TABLET BY MOUTH  DAILY 90 tablet 3  . Blood Glucose Monitoring Suppl (CONTOUR BLOOD GLUCOSE SYSTEM) w/Device KIT Use to check glucose TID. 1 each 0  . glipiZIDE (GLUCOTROL) 10  MG tablet TAKE 1 TABLET BY MOUTH 2  TIMES DAILY BEFORE A MEAL. 180 tablet 3  . glucose blood (ONE TOUCH ULTRA TEST) test strip 1 each by Other route 2 (two) times daily as needed for other. 100 each 5  . glucose blood test strip Use to check glucose TID.  Please dispense appropriate strips for meter dispensed. 100 each 12  . olmesartan-hydrochlorothiazide (BENICAR HCT) 40-25 MG tablet TAKE 1 TABLET BY MOUTH  DAILY 90 tablet 3  . ONETOUCH DELICA LANCETS FINE MISC Inject 1 each into the skin 2 (two) times daily as needed. 100 each 5  . sitaGLIPtin-metformin (JANUMET) 50-1000 MG tablet TAKE 1 TABLET BY MOUTH  TWICE DAILY WITH A MEAL 180 tablet 3  . hydrochlorothiazide (HYDRODIURIL) 25 MG tablet     . nitroGLYCERIN (NITRODUR - DOSED IN MG/24 HR) 0.4 mg/hr patch Place 1/4 patch over painful area on forearm.  Change  daily. Discontinue for headache or dizziness. (Patient not taking: Reported on 09/05/2019) 30 patch 0   No facility-administered medications prior to visit.    Allergies  Allergen Reactions  . Penicillins     ROS Review of Systems  Constitutional: Negative.   HENT: Negative.   Respiratory: Negative.   Cardiovascular: Negative.   Gastrointestinal: Negative.   Genitourinary: Negative.   Musculoskeletal: Negative for gait problem and joint swelling.  Skin: Negative for pallor and rash.  Allergic/Immunologic: Negative for immunocompromised state.  Neurological: Negative for light-headedness and numbness.  Hematological: Does not bruise/bleed easily.  Psychiatric/Behavioral: Negative.       Objective:    Physical Exam  Constitutional: He is oriented to person, place, and time. He appears well-developed and well-nourished. No distress.  HENT:  Head: Normocephalic and atraumatic.  Right Ear: External ear normal.  Left Ear: External ear normal.  Eyes: Conjunctivae are normal. Right eye exhibits no discharge. Left eye exhibits no discharge. No scleral icterus.  Neck: No JVD present. No tracheal deviation present.  Cardiovascular: Normal rate, regular rhythm and normal heart sounds.  Pulses:      Dorsalis pedis pulses are 2+ on the right side and 2+ on the left side.       Posterior tibial pulses are 2+ on the right side and 2+ on the left side.  Pulmonary/Chest: Effort normal and breath sounds normal. No stridor.  Abdominal: Bowel sounds are normal.  Musculoskeletal:        General: No edema.  Neurological: He is alert and oriented to person, place, and time.  Skin: Skin is warm and dry. He is not diaphoretic.  Psychiatric: He has a normal mood and affect. His behavior is normal.   Diabetic Foot Exam - Simple   Simple Foot Form Diabetic Foot exam was performed with the following findings: Yes 09/05/2019  3:38 PM  Visual Inspection No deformities, no ulcerations, no other skin  breakdown bilaterally: Yes Sensation Testing Intact to touch and monofilament testing bilaterally: Yes Pulse Check Posterior Tibialis and Dorsalis pulse intact bilaterally: Yes Comments    BP 134/79   Pulse 90   Temp 97.9 F (36.6 C) (Tympanic)   Ht 5' 11" (1.803 m)   Wt 237 lb 9.6 oz (107.8 kg)   SpO2 98%   BMI 33.14 kg/m  Wt Readings from Last 3 Encounters:  09/05/19 237 lb 9.6 oz (107.8 kg)  01/19/19 229 lb 12.8 oz (104.2 kg)  10/14/18 227 lb (103 kg)     Health Maintenance Due  Topic Date Due  . OPHTHALMOLOGY   EXAM  12/22/2018  . HEMOGLOBIN A1C  07/22/2019    There are no preventive care reminders to display for this patient.  Lab Results  Component Value Date   TSH 1.27 01/12/2018   Lab Results  Component Value Date   WBC 6.7 01/12/2018   HGB 15.8 01/12/2018   HCT 45.2 01/12/2018   MCV 85.3 01/12/2018   PLT 206.0 01/12/2018   Lab Results  Component Value Date   NA 135 01/19/2019   K 4.3 01/19/2019   CO2 27 01/19/2019   GLUCOSE 187 (H) 01/19/2019   BUN 20 01/19/2019   CREATININE 1.10 01/19/2019   BILITOT 1.2 01/19/2019   ALKPHOS 66 01/12/2018   AST 35 01/19/2019   ALT 51 (H) 01/19/2019   PROT 7.7 01/19/2019   ALBUMIN 4.5 01/12/2018   CALCIUM 9.9 01/19/2019   GFR 90.85 04/14/2018   Lab Results  Component Value Date   CHOL 233 (H) 01/19/2019   Lab Results  Component Value Date   HDL 34 (L) 01/19/2019   Lab Results  Component Value Date   LDLCALC 138 (H) 01/19/2019   Lab Results  Component Value Date   TRIG 399 (H) 01/19/2019   Lab Results  Component Value Date   CHOLHDL 6.9 (H) 01/19/2019   Lab Results  Component Value Date   HGBA1C 7.6 (H) 01/19/2019      Assessment & Plan:   Problem List Items Addressed This Visit      Cardiovascular and Mediastinum   HTN (hypertension) - Primary   Relevant Orders   CBC   Comprehensive metabolic panel   Microalbumin / creatinine urine ratio   Urinalysis, Routine w reflex microscopic      Endocrine   DM2 (diabetes mellitus, type 2) (HCC)   Relevant Orders   Comprehensive metabolic panel   Hemoglobin A1c   Microalbumin / creatinine urine ratio   Urinalysis, Routine w reflex microscopic   Ambulatory referral to Ophthalmology     Other   HLD (hyperlipidemia)   Relevant Orders   Comprehensive metabolic panel   LDL cholesterol, direct   Lipid panel    Other Visit Diagnoses    Healthcare maintenance       Relevant Orders   PSA      No orders of the defined types were placed in this encounter.   Follow-up: Return in about 3 months (around 12/06/2019).   Today we discussed weight loss is being key to the control of his diabetes and lipids.  We may need to increase treatment of diabetes and had a statin pending results of today's labs.  William Alfred Kremer, MD 

## 2019-09-05 NOTE — Patient Instructions (Signed)
Diabetes Mellitus and Exercise Exercising regularly is important for your overall health, especially when you have diabetes (diabetes mellitus). Exercising is not only about losing weight. It has many other health benefits, such as increasing muscle strength and bone density and reducing body fat and stress. This leads to improved fitness, flexibility, and endurance, all of which result in better overall health. Exercise has additional benefits for people with diabetes, including:  Reducing appetite.  Helping to lower and control blood glucose.  Lowering blood pressure.  Helping to control amounts of fatty substances (lipids) in the blood, such as cholesterol and triglycerides.  Helping the body to respond better to insulin (improving insulin sensitivity).  Reducing how much insulin the body needs.  Decreasing the risk for heart disease by: ? Lowering cholesterol and triglyceride levels. ? Increasing the levels of good cholesterol. ? Lowering blood glucose levels. What is my activity plan? Your health care provider or certified diabetes educator can help you make a plan for the type and frequency of exercise (activity plan) that works for you. Make sure that you:  Do at least 150 minutes of moderate-intensity or vigorous-intensity exercise each week. This could be brisk walking, biking, or water aerobics. ? Do stretching and strength exercises, such as yoga or weightlifting, at least 2 times a week. ? Spread out your activity over at least 3 days of the week.  Get some form of physical activity every day. ? Do not go more than 2 days in a row without some kind of physical activity. ? Avoid being inactive for more than 30 minutes at a time. Take frequent breaks to walk or stretch.  Choose a type of exercise or activity that you enjoy, and set realistic goals.  Start slowly, and gradually increase the intensity of your exercise over time. What do I need to know about managing my  diabetes?   Check your blood glucose before and after exercising. ? If your blood glucose is 240 mg/dL (13.3 mmol/L) or higher before you exercise, check your urine for ketones. If you have ketones in your urine, do not exercise until your blood glucose returns to normal. ? If your blood glucose is 100 mg/dL (5.6 mmol/L) or lower, eat a snack containing 15-20 grams of carbohydrate. Check your blood glucose 15 minutes after the snack to make sure that your level is above 100 mg/dL (5.6 mmol/L) before you start your exercise.  Know the symptoms of low blood glucose (hypoglycemia) and how to treat it. Your risk for hypoglycemia increases during and after exercise. Common symptoms of hypoglycemia can include: ? Hunger. ? Anxiety. ? Sweating and feeling clammy. ? Confusion. ? Dizziness or feeling light-headed. ? Increased heart rate or palpitations. ? Blurry vision. ? Tingling or numbness around the mouth, lips, or tongue. ? Tremors or shakes. ? Irritability.  Keep a rapid-acting carbohydrate snack available before, during, and after exercise to help prevent or treat hypoglycemia.  Avoid injecting insulin into areas of the body that are going to be exercised. For example, avoid injecting insulin into: ? The arms, when playing tennis. ? The legs, when jogging.  Keep records of your exercise habits. Doing this can help you and your health care provider adjust your diabetes management plan as needed. Write down: ? Food that you eat before and after you exercise. ? Blood glucose levels before and after you exercise. ? The type and amount of exercise you have done. ? When your insulin is expected to peak, if you use   insulin. Avoid exercising at times when your insulin is peaking.  When you start a new exercise or activity, work with your health care provider to make sure the activity is safe for you, and to adjust your insulin, medicines, or food intake as needed.  Drink plenty of water while  you exercise to prevent dehydration or heat stroke. Drink enough fluid to keep your urine clear or pale yellow. Summary  Exercising regularly is important for your overall health, especially when you have diabetes (diabetes mellitus).  Exercising has many health benefits, such as increasing muscle strength and bone density and reducing body fat and stress.  Your health care provider or certified diabetes educator can help you make a plan for the type and frequency of exercise (activity plan) that works for you.  When you start a new exercise or activity, work with your health care provider to make sure the activity is safe for you, and to adjust your insulin, medicines, or food intake as needed. This information is not intended to replace advice given to you by your health care provider. Make sure you discuss any questions you have with your health care provider. Document Revised: 01/15/2017 Document Reviewed: 12/03/2015 Elsevier Patient Education  2020 [employer]  

## 2019-09-06 LAB — COMPREHENSIVE METABOLIC PANEL
ALT: 56 U/L — ABNORMAL HIGH (ref 0–53)
AST: 32 U/L (ref 0–37)
Albumin: 4.2 g/dL (ref 3.5–5.2)
Alkaline Phosphatase: 68 U/L (ref 39–117)
BUN: 15 mg/dL (ref 6–23)
CO2: 25 mEq/L (ref 19–32)
Calcium: 9.7 mg/dL (ref 8.4–10.5)
Chloride: 100 mEq/L (ref 96–112)
Creatinine, Ser: 1.08 mg/dL (ref 0.40–1.50)
GFR: 72.4 mL/min (ref >60.00)
Glucose, Bld: 164 mg/dL — ABNORMAL HIGH (ref 70–99)
Potassium: 3.6 mEq/L (ref 3.5–5.1)
Sodium: 136 mEq/L (ref 135–145)
Total Bilirubin: 0.8 mg/dL (ref 0.2–1.2)
Total Protein: 7.2 g/dL (ref 6.0–8.3)

## 2019-09-06 LAB — MICROALBUMIN / CREATININE URINE RATIO
Creatinine,U: 129.6 mg/dL
Microalb Creat Ratio: 0.8 mg/g (ref 0.0–30.0)
Microalb, Ur: 1 mg/dL (ref 0.0–1.9)

## 2019-09-06 LAB — URINALYSIS, ROUTINE W REFLEX MICROSCOPIC
Bilirubin Urine: NEGATIVE
Hgb urine dipstick: NEGATIVE
Ketones, ur: NEGATIVE
Leukocytes,Ua: NEGATIVE
Nitrite: NEGATIVE
RBC / HPF: NONE SEEN (ref 0–?)
Specific Gravity, Urine: 1.025 (ref 1.000–1.030)
Total Protein, Urine: NEGATIVE
Urine Glucose: 250 — AB
Urobilinogen, UA: 0.2 (ref 0.0–1.0)
pH: 5.5 (ref 5.0–8.0)

## 2019-09-06 LAB — CBC
HCT: 44 % (ref 39.0–52.0)
Hemoglobin: 15.4 g/dL (ref 13.0–17.0)
MCHC: 34.9 g/dL (ref 30.0–36.0)
MCV: 86.6 fl (ref 78.0–100.0)
Platelets: 182 10*3/uL (ref 150.0–400.0)
RBC: 5.08 Mil/uL (ref 4.22–5.81)
RDW: 13.2 % (ref 11.5–15.5)
WBC: 7.8 10*3/uL (ref 4.0–10.5)

## 2019-09-06 LAB — LIPID PANEL
Cholesterol: 190 mg/dL (ref 0–200)
HDL: 27.1 mg/dL — ABNORMAL LOW (ref >39.00)
NonHDL: 162.95
Total CHOL/HDL Ratio: 7
Triglycerides: 327 mg/dL — ABNORMAL HIGH (ref 0.0–149.0)
VLDL: 65.4 mg/dL — ABNORMAL HIGH (ref 0.0–40.0)

## 2019-09-06 LAB — LDL CHOLESTEROL, DIRECT: Direct LDL: 114 mg/dL

## 2019-09-06 LAB — HEMOGLOBIN A1C: Hgb A1c MFr Bld: 9.4 % — ABNORMAL HIGH (ref 4.6–6.5)

## 2019-09-06 LAB — PSA: PSA: 0.36 ng/mL (ref 0.10–4.00)

## 2019-10-03 ENCOUNTER — Telehealth (INDEPENDENT_AMBULATORY_CARE_PROVIDER_SITE_OTHER): Payer: 59 | Admitting: Family Medicine

## 2019-10-03 ENCOUNTER — Encounter: Payer: Self-pay | Admitting: Family Medicine

## 2019-10-03 VITALS — Temp 97.5°F | Ht 71.0 in

## 2019-10-03 DIAGNOSIS — E7800 Pure hypercholesterolemia, unspecified: Secondary | ICD-10-CM | POA: Insufficient documentation

## 2019-10-03 DIAGNOSIS — E119 Type 2 diabetes mellitus without complications: Secondary | ICD-10-CM | POA: Diagnosis not present

## 2019-10-03 MED ORDER — ATORVASTATIN CALCIUM 10 MG PO TABS: 10.0000 mg | ORAL_TABLET | Freq: Every day | ORAL | 3 refills | Status: DC

## 2019-10-03 MED ORDER — DAPAGLIFLOZIN PROPANEDIOL 10 MG PO TABS: 10.0000 mg | ORAL_TABLET | Freq: Every day | ORAL | 2 refills | Status: DC

## 2019-10-03 NOTE — Progress Notes (Signed)
Established Patient Office Visit  Subjective:  Patient ID: Eric Sellers, male    DOB: June 01, 1971  Age: 49 y.o. MRN: [id number]  CC:  Chief Complaint  Patient presents with  . Follow-up    discuss labs     HPI Eric Sellers presents for follow-up of his diabetes and elevated cholesterol.  Hemoglobin A1c went from 7.4 up to 9.2.  He is gaining weight.  Admits to dietary indiscretion carbohydrate.  Has been taking Glucotrol and Janumet.  He is compliant with his medications.  He did make an appointment to see the eye doctor.  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  He is accompanied by his fiance today.  Ellene Route comes affirms that he is drinking no more than 2 beers weekly.  Past Medical History:  Diagnosis Date  . Diabetes mellitus without complication ([place])   . History of kidney stones   . Hypertension     Past Surgical History:  Procedure Laterality Date  . TONSILLECTOMY      Family History  Problem Relation Age of Onset  . Diabetes Mother   . Hypertension Mother   . Diabetes Father   . Hypertension Father   . Prostate cancer Father     Social History   Socioeconomic History  . Marital status: Single    Spouse name: Not on file  . Number of children: Not on file  . Years of education: Not on file  . Highest education level: Not on file  Occupational History  . Not on file  Tobacco Use  . Smoking status: Never Smoker  . Smokeless tobacco: Never Used  Substance and Sexual Activity  . Alcohol use: Yes    Alcohol/week: 5.0 standard drinks    Types: 5 Cans of beer per week    Comment: occasional  . Drug use: No  . Sexual activity: Yes  Other Topics Concern  . Not on file  Social History Narrative  . Not on file   Social Determinants of Health   Financial Resource Strain:   . Difficulty of Paying Living Expenses:   Food Insecurity:   . Worried About Charity fundraiser in the Last Year:   . Arboriculturist in the Last  Year:   Transportation Needs:   . Film/video editor (Medical):   Marland Kitchen Lack of Transportation (Non-Medical):   Physical Activity:   . Days of Exercise per Week:   . Minutes of Exercise per Session:   Stress:   . Feeling of Stress :   Social Connections:   . Frequency of Communication with Friends and Family:   . Frequency of Social Gatherings with Friends and Family:   . Attends Religious Services:   . Active Member of Clubs or Organizations:   . Attends Archivist Meetings:   Marland Kitchen Marital Status:   Intimate Partner Violence:   . Fear of Current or Ex-Partner:   . Emotionally Abused:   Marland Kitchen Physically Abused:   . Sexually Abused:     Outpatient Medications Prior to Visit  Medication Sig Dispense Refill  . amLODipine (NORVASC) 5 MG tablet TAKE 1 TABLET BY MOUTH  DAILY 90 tablet 3  . Blood Glucose Monitoring Suppl (CONTOUR BLOOD GLUCOSE SYSTEM) w/Device KIT Use to check glucose TID. 1 each 0  . glipiZIDE (GLUCOTROL) 10 MG tablet TAKE 1 TABLET BY MOUTH 2  TIMES DAILY BEFORE A MEAL. 180 tablet 3  . glucose  blood (ONE TOUCH ULTRA TEST) test strip 1 each by Other route 2 (two) times daily as needed for other. 100 each 5  . glucose blood test strip Use to check glucose TID.  Please dispense appropriate strips for meter dispensed. 100 each 12  . olmesartan-hydrochlorothiazide (BENICAR HCT) 40-25 MG tablet TAKE 1 TABLET BY MOUTH  DAILY 90 tablet 3  . ONETOUCH DELICA LANCETS FINE MISC Inject 1 each into the skin 2 (two) times daily as needed. 100 each 5  . sitaGLIPtin-metformin (JANUMET) 50-1000 MG tablet TAKE 1 TABLET BY MOUTH  TWICE DAILY WITH A MEAL 180 tablet 3   No facility-administered medications prior to visit.    Allergies  Allergen Reactions  . Penicillins     ROS Review of Systems  Constitutional: Negative.   HENT: Negative.   Respiratory: Negative.   Cardiovascular: Negative.   Gastrointestinal: Negative.   Endocrine: Negative for polyphagia and polyuria.    Psychiatric/Behavioral: Negative.       Objective:    Physical Exam  Constitutional: He is oriented to person, place, and time. No distress.  Pulmonary/Chest: Effort normal.  Neurological: He is alert and oriented to person, place, and time.  Psychiatric: He has a normal mood and affect. His behavior is normal.    Temp (!) 97.5 F (36.4 C) (Tympanic)   Ht _0  (1.803 m)   BMI 33.14 kg/m  Wt Readings from Last 3 Encounters:  09/05/19 237 lb 9.6 oz (107.8 kg)  01/19/19 229 lb 12.8 oz (104.2 kg)  10/14/18 227 lb (103 kg)     Health Maintenance Due  Topic Date Due  . OPHTHALMOLOGY EXAM  12/22/2018    There are no preventive care reminders to display for this patient.  Lab Results  Component Value Date   TSH 1.27 01/12/2018   Lab Results  Component Value Date   WBC 7.8 09/05/2019   HGB 15.4 09/05/2019   HCT 44.0 09/05/2019   MCV 86.6 09/05/2019   PLT 182.0 09/05/2019   Lab Results  Component Value Date   NA 136 09/05/2019   K 3.6 09/05/2019   CO2 25 09/05/2019   GLUCOSE 164 (H) 09/05/2019   BUN 15 09/05/2019   CREATININE 1.08 09/05/2019   BILITOT 0.8 09/05/2019   ALKPHOS 68 09/05/2019   AST 32 09/05/2019   ALT 56 (H) 09/05/2019   PROT 7.2 09/05/2019   ALBUMIN 4.2 09/05/2019   CALCIUM 9.7 09/05/2019   GFR 72.40 09/05/2019   Lab Results  Component Value Date   CHOL 190 09/05/2019   Lab Results  Component Value Date   HDL 27.10 (L) 09/05/2019   Lab Results  Component Value Date   LDLCALC 138 (H) 01/19/2019   Lab Results  Component Value Date   TRIG 327.0 (H) 09/05/2019   Lab Results  Component Value Date   CHOLHDL 7 09/05/2019   Lab Results  Component Value Date   HGBA1C 9.4 (H) 09/05/2019      Assessment & Plan:   Problem List Items Addressed This Visit      Endocrine   Type 2 diabetes mellitus without complication, without long-term current use of insulin (HCC) - Primary   Relevant Medications   dapagliflozin propanediol  (FARXIGA) 10 MG TABS tablet   atorvastatin (LIPITOR) 10 MG tablet     Other   Pure hypercholesterolemia   Relevant Medications   atorvastatin (LIPITOR) 10 MG tablet      Meds ordered this encounter  Medications  . dapagliflozin  propanediol (FARXIGA) 10 MG TABS tablet    Sig: Take 10 mg by mouth daily before breakfast.    Dispense:  30 tablet    Refill:  2  . atorvastatin (LIPITOR) 10 MG tablet    Sig: Take 1 tablet (10 mg total) by mouth daily.    Dispense:  90 tablet    Refill:  3    Follow-up: Return in June.   With his DOT due in 5 months, he will continue his current diabetes medicines and will add [place].  Warned about increased frequency of urination.  Discussed the higher risk of vascular disease with diabetes and his elevated triglycerides.  Believe that his elevated liver enzyme is most likely due to fatty liver disease.  We will start low-dose Lipitor.  He is scheduled for follow-up in June.  Virtual Visit via Video Note  I connected with Eric Sellers on 10/03/19 at  1:00 PM EDT by a video enabled telemedicine application and verified that I am speaking with the correct person using two identifiers.  Location: Patient: home with fiancee.  Provider:    I discussed the limitations of evaluation and management by telemedicine and the availability of in person appointments. The patient expressed understanding and agreed to proceed.  History of Present Illness:    Observations/Objective:   Assessment and Plan:   Follow Up Instructions:    I discussed the assessment and treatment plan with the patient. The patient was provided an opportunity to ask questions and all were answered. The patient agreed with the plan and demonstrated an understanding of the instructions.   The patient was advised to call back or seek an in-person evaluation if the symptoms worsen or if the condition fails to improve as anticipated.  I provided 25 minutes of non-face-to-face  time during this encounter.   Libby Maw, MD  Libby Maw, MD   Interactive video and audio telecommunications were attempted between myself and the patient. However they failed due to the patient having technical difficulties or not having access to video capability. We continued and completed with audio only.

## 2019-10-10 ENCOUNTER — Telehealth: Payer: Self-pay | Admitting: Family Medicine

## 2019-10-10 NOTE — Telephone Encounter (Signed)
Patient wife is calling and wanting to speak to someone regarding medication. CB is [phone]

## 2019-10-12 NOTE — Telephone Encounter (Signed)
I spoke with pt and he informed me that the pharmacy and they told him that he will need a PA for Farxiga 10mg .

## 2019-10-18 NOTE — Telephone Encounter (Signed)
Patient's wife stated the pharmacy has not yet received authorization for this prescription, [facility] on [address]

## 2019-10-20 NOTE — Telephone Encounter (Signed)
Rx approved patient and pharmacy aware, patient will pick up today.

## 2019-11-29 ENCOUNTER — Other Ambulatory Visit: Payer: Self-pay | Admitting: Family Medicine

## 2019-11-29 DIAGNOSIS — E119 Type 2 diabetes mellitus without complications: Secondary | ICD-10-CM

## 2019-12-12 ENCOUNTER — Other Ambulatory Visit: Payer: Self-pay

## 2019-12-12 ENCOUNTER — Encounter: Payer: Self-pay | Admitting: Family Medicine

## 2019-12-12 ENCOUNTER — Ambulatory Visit: Payer: 59 | Admitting: Family Medicine

## 2019-12-12 VITALS — BP 118/68 | HR 89 | Temp 98.7°F | Ht 71.0 in | Wt 238.0 lb

## 2019-12-12 DIAGNOSIS — E1165 Type 2 diabetes mellitus with hyperglycemia: Secondary | ICD-10-CM | POA: Insufficient documentation

## 2019-12-12 DIAGNOSIS — I10 Essential (primary) hypertension: Secondary | ICD-10-CM

## 2019-12-12 DIAGNOSIS — E7800 Pure hypercholesterolemia, unspecified: Secondary | ICD-10-CM

## 2019-12-12 DIAGNOSIS — E669 Obesity, unspecified: Secondary | ICD-10-CM | POA: Diagnosis not present

## 2019-12-12 LAB — BASIC METABOLIC PANEL
BUN: 25 mg/dL — ABNORMAL HIGH (ref 6–23)
CO2: 24 mEq/L (ref 19–32)
Calcium: 9.8 mg/dL (ref 8.4–10.5)
Chloride: 101 mEq/L (ref 96–112)
Creatinine, Ser: 1.13 mg/dL (ref 0.40–1.50)
GFR: 68.65 mL/min (ref >60.00)
Glucose, Bld: 163 mg/dL — ABNORMAL HIGH (ref 70–99)
Potassium: 5 mEq/L (ref 3.5–5.1)
Sodium: 134 mEq/L — ABNORMAL LOW (ref 135–145)

## 2019-12-12 LAB — HEPATIC FUNCTION PANEL
ALT: 35 U/L (ref 0–53)
AST: 27 U/L (ref 0–37)
Albumin: 4.6 g/dL (ref 3.5–5.2)
Alkaline Phosphatase: 65 U/L (ref 39–117)
Bilirubin, Direct: 0.1 mg/dL (ref 0.0–0.3)
Total Bilirubin: 0.8 mg/dL (ref 0.2–1.2)
Total Protein: 7.4 g/dL (ref 6.0–8.3)

## 2019-12-12 LAB — CBC
HCT: 46.5 % (ref 39.0–52.0)
Hemoglobin: 16.3 g/dL (ref 13.0–17.0)
MCHC: 35.1 g/dL (ref 30.0–36.0)
MCV: 86.9 fl (ref 78.0–100.0)
Platelets: 161 10*3/uL (ref 150.0–400.0)
RBC: 5.35 Mil/uL (ref 4.22–5.81)
RDW: 12.8 % (ref 11.5–15.5)
WBC: 6.9 10*3/uL (ref 4.0–10.5)

## 2019-12-12 LAB — LIPID PANEL
Cholesterol: 152 mg/dL (ref 0–200)
HDL: 29 mg/dL — ABNORMAL LOW (ref >39.00)
NonHDL: 123.19
Total CHOL/HDL Ratio: 5
Triglycerides: 234 mg/dL — ABNORMAL HIGH (ref 0.0–149.0)
VLDL: 46.8 mg/dL — ABNORMAL HIGH (ref 0.0–40.0)

## 2019-12-12 LAB — HEMOGLOBIN A1C: Hgb A1c MFr Bld: 8.5 % — ABNORMAL HIGH (ref 4.6–6.5)

## 2019-12-12 LAB — MICROALBUMIN / CREATININE URINE RATIO
Creatinine,U: 52 mg/dL
Microalb Creat Ratio: 1.3 mg/g (ref 0.0–30.0)
Microalb, Ur: <0.7 mg/dL (ref 0.0–1.9)

## 2019-12-12 LAB — LDL CHOLESTEROL, DIRECT: Direct LDL: 83 mg/dL

## 2019-12-12 MED ORDER — OZEMPIC (0.25 OR 0.5 MG/DOSE) 2 MG/1.5ML ~~LOC~~ SOPN: 0.5000 mg | PEN_INJECTOR | SUBCUTANEOUS | 3 refills | Status: DC

## 2019-12-12 MED ORDER — METFORMIN HCL 1000 MG PO TABS: 1000.0000 mg | ORAL_TABLET | Freq: Two times a day (BID) | ORAL | 3 refills | Status: DC

## 2019-12-12 NOTE — Progress Notes (Addendum)
Established Patient Office Visit  Subjective:  Patient ID: Eric Sellers, male    DOB: Jun 27, 1971  Age: 49 y.o. MRN: [id number]  CC:  Chief Complaint  Patient presents with  . Follow-up    3 month follow up on diabetes. Patienr would like to talk about starting on medications to help with weight loss and diabetes.     HPI Eric Sellers presents for follow-up of his diabetes, hypertension and cholesterol.  Tolerating the amlodipine well.  BP much improved.  Has gained a little bit of weight.  Due to preop Po approval issues has only been taking the [place] for about 6 weeks now.  Tolerating it well.  Would like to also try something to help him lose weight.  Tolerating atorvastatin.  Past Medical History:  Diagnosis Date  . Diabetes mellitus without complication ([place])   . History of kidney stones   . Hypertension     Past Surgical History:  Procedure Laterality Date  . TONSILLECTOMY      Family History  Problem Relation Age of Onset  . Diabetes Mother   . Hypertension Mother   . Diabetes Father   . Hypertension Father   . Prostate cancer Father     Social History   Socioeconomic History  . Marital status: Single    Spouse name: Not on file  . Number of children: Not on file  . Years of education: Not on file  . Highest education level: Not on file  Occupational History  . Not on file  Tobacco Use  . Smoking status: Never Smoker  . Smokeless tobacco: Never Used  Substance and Sexual Activity  . Alcohol use: Yes    Alcohol/week: 5.0 standard drinks    Types: 5 Cans of beer per week    Comment: occasional  . Drug use: No  . Sexual activity: Yes  Other Topics Concern  . Not on file  Social History Narrative  . Not on file   Social Determinants of Health   Financial Resource Strain:   . Difficulty of Paying Living Expenses:   Food Insecurity:   . Worried About Charity fundraiser in the Last Year:   . Arboriculturist in the Last Year:    Transportation Needs:   . Film/video editor (Medical):   Marland Kitchen Lack of Transportation (Non-Medical):   Physical Activity:   . Days of Exercise per Week:   . Minutes of Exercise per Session:   Stress:   . Feeling of Stress :   Social Connections:   . Frequency of Communication with Friends and Family:   . Frequency of Social Gatherings with Friends and Family:   . Attends Religious Services:   . Active Member of Clubs or Organizations:   . Attends Archivist Meetings:   Marland Kitchen Marital Status:   Intimate Partner Violence:   . Fear of Current or Ex-Partner:   . Emotionally Abused:   Marland Kitchen Physically Abused:   . Sexually Abused:     Outpatient Medications Prior to Visit  Medication Sig Dispense Refill  . amLODipine (NORVASC) 5 MG tablet TAKE 1 TABLET BY MOUTH  DAILY 90 tablet 3  . Blood Glucose Monitoring Suppl (CONTOUR BLOOD GLUCOSE SYSTEM) w/Device KIT Use to check glucose TID. 1 each 0  . FARXIGA 10 MG TABS tablet TAKE 1 TABLET BY MOUTH  DAILY BEFORE BREAKFAST 90 tablet 3  . glucose blood (ONE TOUCH ULTRA TEST) test strip 1 each by Other route  2 (two) times daily as needed for other. 100 each 5  . glucose blood test strip Use to check glucose TID.  Please dispense appropriate strips for meter dispensed. 100 each 12  . olmesartan-hydrochlorothiazide (BENICAR HCT) 40-25 MG tablet TAKE 1 TABLET BY MOUTH  DAILY 90 tablet 3  . ONETOUCH DELICA LANCETS FINE MISC Inject 1 each into the skin 2 (two) times daily as needed. 100 each 5  . atorvastatin (LIPITOR) 10 MG tablet Take 1 tablet (10 mg total) by mouth daily. 90 tablet 3  . glipiZIDE (GLUCOTROL) 10 MG tablet TAKE 1 TABLET BY MOUTH 2  TIMES DAILY BEFORE A MEAL. 180 tablet 3  . sitaGLIPtin-metformin (JANUMET) 50-1000 MG tablet TAKE 1 TABLET BY MOUTH  TWICE DAILY WITH A MEAL 180 tablet 3   No facility-administered medications prior to visit.    Allergies  Allergen Reactions  . Penicillins     ROS Review of Systems   Constitutional: Negative.   Respiratory: Negative.   Cardiovascular: Negative.   Gastrointestinal: Negative.   Endocrine: Negative for polyphagia and polyuria.  Musculoskeletal: Negative for myalgias.  Allergic/Immunologic: Negative for immunocompromised state.  Neurological: Negative for light-headedness.  Hematological: Does not bruise/bleed easily.  Psychiatric/Behavioral: Negative.       Objective:    Physical Exam  Constitutional: He is oriented to person, place, and time. He appears well-developed and well-nourished. No distress.  HENT:  Head: Normocephalic and atraumatic.  Right Ear: External ear normal.  Left Ear: External ear normal.  Eyes: Conjunctivae are normal. Right eye exhibits no discharge. Left eye exhibits no discharge. No scleral icterus.  Neck: No JVD present. No tracheal deviation present.  Cardiovascular: Normal rate, regular rhythm and normal heart sounds.  Pulmonary/Chest: Effort normal and breath sounds normal. No stridor.  Neurological: He is alert and oriented to person, place, and time.  Skin: He is not diaphoretic.  Psychiatric: He has a normal mood and affect. His behavior is normal.    BP 118/68   Pulse 89   Temp 98.7 F (37.1 C) (Tympanic)   Ht '5\' 11"'$  (1.803 m)   Wt 238 lb (108 kg)   SpO2 98%   BMI 33.19 kg/m  Wt Readings from Last 3 Encounters:  12/12/19 238 lb (108 kg)  09/05/19 237 lb 9.6 oz (107.8 kg)  01/19/19 229 lb 12.8 oz (104.2 kg)     Health Maintenance Due  Topic Date Due  . Hepatitis C Screening  Never done  . COVID-19 Vaccine (1) Never done  . OPHTHALMOLOGY EXAM  12/22/2018  . COLONOSCOPY  Never done    There are no preventive care reminders to display for this patient.  Lab Results  Component Value Date   TSH 1.27 01/12/2018   Lab Results  Component Value Date   WBC 6.9 12/12/2019   HGB 16.3 12/12/2019   HCT 46.5 12/12/2019   MCV 86.9 12/12/2019   PLT 161.0 12/12/2019   Lab Results  Component Value  Date   NA 134 (L) 12/12/2019   K 5.0 12/12/2019   CO2 24 12/12/2019   GLUCOSE 163 (H) 12/12/2019   BUN 25 (H) 12/12/2019   CREATININE 1.13 12/12/2019   BILITOT 0.8 12/12/2019   ALKPHOS 65 12/12/2019   AST 27 12/12/2019   ALT 35 12/12/2019   PROT 7.4 12/12/2019   ALBUMIN 4.6 12/12/2019   CALCIUM 9.8 12/12/2019   GFR 68.65 12/12/2019   Lab Results  Component Value Date   CHOL 152 12/12/2019   Lab  Results  Component Value Date   HDL 29.00 (L) 12/12/2019   Lab Results  Component Value Date   LDLCALC 138 (H) 01/19/2019   Lab Results  Component Value Date   TRIG 234.0 (H) 12/12/2019   Lab Results  Component Value Date   CHOLHDL 5 12/12/2019   Lab Results  Component Value Date   HGBA1C 8.5 (H) 12/12/2019      Assessment & Plan:   Problem List Items Addressed This Visit      Cardiovascular and Mediastinum   Essential hypertension   Relevant Medications   atorvastatin (LIPITOR) 20 MG tablet   Other Relevant Orders   Basic metabolic panel (Completed)   CBC (Completed)   Hepatic function panel (Completed)   Microalbumin / creatinine urine ratio (Completed)     Endocrine   Uncontrolled type 2 diabetes mellitus with hyperglycemia (HCC) - Primary   Relevant Medications   metFORMIN (GLUCOPHAGE) 1000 MG tablet   Semaglutide,0.25 or 0.'5MG'$ /DOS, (OZEMPIC, 0.25 OR 0.5 MG/DOSE,) 2 MG/1.5ML SOPN   atorvastatin (LIPITOR) 20 MG tablet   Other Relevant Orders   Basic metabolic panel (Completed)   CBC (Completed)   Hemoglobin A1c (Completed)   Microalbumin / creatinine urine ratio (Completed)     Other   Elevated LDL cholesterol level   Relevant Medications   atorvastatin (LIPITOR) 20 MG tablet   Other Relevant Orders   Hepatic function panel (Completed)   Lipid panel (Completed)   LDL cholesterol, direct (Completed)    Other Visit Diagnoses    Obesity (BMI 30-39.9)       Relevant Medications   metFORMIN (GLUCOPHAGE) 1000 MG tablet   Semaglutide,0.25 or  0.'5MG'$ /DOS, (OZEMPIC, 0.25 OR 0.5 MG/DOSE,) 2 MG/1.5ML SOPN      Meds ordered this encounter  Medications  . metFORMIN (GLUCOPHAGE) 1000 MG tablet    Sig: Take 1 tablet (1,000 mg total) by mouth 2 (two) times daily with a meal.    Dispense:  180 tablet    Refill:  3  . DISCONTD: Semaglutide,0.25 or 0.'5MG'$ /DOS, (OZEMPIC, 0.25 OR 0.5 MG/DOSE,) 2 MG/1.5ML SOPN    Sig: Inject 0.375 mLs (0.5 mg total) into the skin once a week.    Dispense:  3 pen    Refill:  3    Please instruct.  Please give needles.  . Semaglutide,0.25 or 0.'5MG'$ /DOS, (OZEMPIC, 0.25 OR 0.5 MG/DOSE,) 2 MG/1.5ML SOPN    Sig: Inject 0.375 mLs (0.5 mg total) into the skin once a week.    Dispense:  3 pen    Refill:  3    Please instruct.  Please give needles.  Marland Kitchen atorvastatin (LIPITOR) 20 MG tablet    Sig: Take 1 tablet (20 mg total) by mouth daily.    Dispense:  90 tablet    Refill:  3    Follow-up: Return in about 3 months (around 03/13/2020), or stop glipzide and janumet..   Discontinued glipizide and Janumet.  We will start semaglutide 0.5 mg once weekly.  Added Metformin 1000 twice daily.  Encouraged weight loss with primarily dietary changes augmented by cardio workouts. Libby Maw, MD

## 2019-12-13 MED ORDER — ATORVASTATIN CALCIUM 20 MG PO TABS: 20.0000 mg | ORAL_TABLET | Freq: Every day | ORAL | 3 refills | Status: DC

## 2019-12-13 NOTE — Addendum Note (Signed)
Addended by: Andrez Grime on: 12/13/2019 08:27 AM   Modules accepted: Orders

## 2020-01-17 ENCOUNTER — Other Ambulatory Visit: Payer: Self-pay | Admitting: Family Medicine

## 2020-02-21 ENCOUNTER — Encounter: Payer: Self-pay | Admitting: Family Medicine

## 2020-02-21 LAB — HM DIABETES EYE EXAM

## 2020-02-29 ENCOUNTER — Encounter: Payer: Self-pay | Admitting: Family Medicine

## 2020-03-19 ENCOUNTER — Ambulatory Visit: Payer: 59 | Admitting: Family Medicine

## 2020-03-22 ENCOUNTER — Other Ambulatory Visit: Payer: Self-pay

## 2020-03-23 ENCOUNTER — Ambulatory Visit: Payer: 59 | Admitting: Family Medicine

## 2020-03-23 ENCOUNTER — Encounter: Payer: Self-pay | Admitting: Family Medicine

## 2020-03-23 VITALS — BP 118/66 | HR 113 | Temp 97.4°F | Ht 71.0 in | Wt 216.4 lb

## 2020-03-23 DIAGNOSIS — I10 Essential (primary) hypertension: Secondary | ICD-10-CM

## 2020-03-23 DIAGNOSIS — E7800 Pure hypercholesterolemia, unspecified: Secondary | ICD-10-CM

## 2020-03-23 DIAGNOSIS — E119 Type 2 diabetes mellitus without complications: Secondary | ICD-10-CM

## 2020-03-23 LAB — LDL CHOLESTEROL, DIRECT: Direct LDL: 65 mg/dL

## 2020-03-23 LAB — BASIC METABOLIC PANEL
BUN: 25 mg/dL — ABNORMAL HIGH (ref 6–23)
CO2: 25 mEq/L (ref 19–32)
Calcium: 9.7 mg/dL (ref 8.4–10.5)
Chloride: 97 mEq/L (ref 96–112)
Creatinine, Ser: 1.28 mg/dL (ref 0.40–1.50)
GFR: 59.38 mL/min — ABNORMAL LOW (ref >60.00)
Glucose, Bld: 163 mg/dL — ABNORMAL HIGH (ref 70–99)
Potassium: 4.2 mEq/L (ref 3.5–5.1)
Sodium: 136 mEq/L (ref 135–145)

## 2020-03-23 LAB — HEMOGLOBIN A1C: Hgb A1c MFr Bld: 9.4 % — ABNORMAL HIGH (ref 4.6–6.5)

## 2020-03-23 LAB — GLUCOSE, POCT (MANUAL RESULT ENTRY): POC Glucose: 169 mg/dl — AB (ref 70–99)

## 2020-03-23 NOTE — Progress Notes (Signed)
Established Patient Office Visit  Subjective:  Patient ID: Eric Sellers, male    DOB: August 02, 1970  Age: 49 y.o. MRN: [id number]  CC:  Chief Complaint  Patient presents with  . Follow-up    follow up on BP and DM, light head feelings that come and go. Low BP readings     HPI Eric Sellers presents for follow-up of hypertension, diabetes and elevated ldl cholesterol.  Patient brings in his blood pressure cuff which shows a blood pressure of 121/78 compared with our blood pressure of 118/66. He  Reports that individual blood sugars have been in the higher range.  He is having no issues taking the higher dose of atorvastatin at 20 mg.  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  He is finding time to exercise at the gym and overall feels much better.  He has no family history of thyroid cancer.  Patient's glucometer showed 176 and ours was 169.  Past Medical History:  Diagnosis Date  . Diabetes mellitus without complication ([place])   . History of kidney stones   . Hypertension     Past Surgical History:  Procedure Laterality Date  . TONSILLECTOMY      Family History  Problem Relation Age of Onset  . Diabetes Mother   . Hypertension Mother   . Diabetes Father   . Hypertension Father   . Prostate cancer Father     Social History   Socioeconomic History  . Marital status: Single    Spouse name: Not on file  . Number of children: Not on file  . Years of education: Not on file  . Highest education level: Not on file  Occupational History  . Not on file  Tobacco Use  . Smoking status: Never Smoker  . Smokeless tobacco: Never Used  Substance and Sexual Activity  . Alcohol use: Yes    Alcohol/week: 5.0 standard drinks    Types: 5 Cans of beer per week    Comment: occasional  . Drug use: No  . Sexual activity: Yes  Other Topics Concern  . Not on file  Social History Narrative  . Not on file   Social Determinants of Health   Financial Resource  Strain:   . Difficulty of Paying Living Expenses: Not on file  Food Insecurity:   . Worried About Charity fundraiser in the Last Year: Not on file  . Ran Out of Food in the Last Year: Not on file  Transportation Needs:   . Lack of Transportation (Medical): Not on file  . Lack of Transportation (Non-Medical): Not on file  Physical Activity:   . Days of Exercise per Week: Not on file  . Minutes of Exercise per Session: Not on file  Stress:   . Feeling of Stress : Not on file  Social Connections:   . Frequency of Communication with Friends and Family: Not on file  . Frequency of Social Gatherings with Friends and Family: Not on file  . Attends Religious Services: Not on file  . Active Member of Clubs or Organizations: Not on file  . Attends Archivist Meetings: Not on file  . Marital Status: Not on file  Intimate Partner Violence:   . Fear of Current or Ex-Partner: Not on file  . Emotionally Abused: Not on file  . Physically Abused: Not on file  . Sexually Abused: Not on file    Outpatient Medications Prior to Visit  Medication  Sig Dispense Refill  . atorvastatin (LIPITOR) 20 MG tablet Take 1 tablet (20 mg total) by mouth daily. 90 tablet 3  . Blood Glucose Monitoring Suppl (CONTOUR BLOOD GLUCOSE SYSTEM) w/Device KIT Use to check glucose TID. 1 each 0  . CONTOUR NEXT TEST test strip USE TO CHECK GLUCOSE 3 TIMES A DAY 100 strip 12  . FARXIGA 10 MG TABS tablet TAKE 1 TABLET BY MOUTH  DAILY BEFORE BREAKFAST 90 tablet 3  . glucose blood (ONE TOUCH ULTRA TEST) test strip 1 each by Other route 2 (two) times daily as needed for other. 100 each 5  . metFORMIN (GLUCOPHAGE) 1000 MG tablet Take 1 tablet (1,000 mg total) by mouth 2 (two) times daily with a meal. 180 tablet 3  . olmesartan-hydrochlorothiazide (BENICAR HCT) 40-25 MG tablet TAKE 1 TABLET BY MOUTH  DAILY 90 tablet 3  . ONETOUCH DELICA LANCETS FINE MISC Inject 1 each into the skin 2 (two) times daily as needed. 100 each 5   . Semaglutide,0.25 or 0.5MG /DOS, (OZEMPIC, 0.25 OR 0.5 MG/DOSE,) 2 MG/1.5ML SOPN Inject 0.375 mLs (0.5 mg total) into the skin once a week. 3 pen 3  . amLODipine (NORVASC) 5 MG tablet TAKE 1 TABLET BY MOUTH  DAILY 90 tablet 3   No facility-administered medications prior to visit.    Allergies  Allergen Reactions  . Penicillins     ROS Review of Systems  Constitutional: Negative.   HENT: Negative.   Respiratory: Negative.   Cardiovascular: Negative.   Gastrointestinal: Negative.   Endocrine: Negative for polyphagia and polyuria.  Genitourinary: Negative.   Neurological: Negative for light-headedness and headaches.  Hematological: Does not bruise/bleed easily.  Psychiatric/Behavioral: Negative.       Objective:    Physical Exam Vitals and nursing note reviewed.  Constitutional:      General: He is not in acute distress.    Appearance: Normal appearance. He is not ill-appearing, toxic-appearing or diaphoretic.  HENT:     Head: Normocephalic and atraumatic.     Right Ear: Tympanic membrane, ear canal and external ear normal.     Left Ear: Ear canal and external ear normal.  Eyes:     General: No scleral icterus.       Right eye: No discharge.     Extraocular Movements: Extraocular movements intact.     Conjunctiva/sclera: Conjunctivae normal.     Pupils: Pupils are equal, round, and reactive to light.  Cardiovascular:     Rate and Rhythm: Normal rate and regular rhythm.  Pulmonary:     Effort: Pulmonary effort is normal.     Breath sounds: Normal breath sounds.  Musculoskeletal:     Cervical back: No rigidity or tenderness.  Lymphadenopathy:     Cervical: No cervical adenopathy.  Skin:    General: Skin is warm and dry.  Neurological:     Mental Status: He is alert and oriented to person, place, and time.  Psychiatric:        Mood and Affect: Mood normal.        Behavior: Behavior normal.     BP 118/66   Pulse (!) 113   Temp (!) 97.4 F (36.3 C)  (Tympanic)   Ht 5\' 11"  (1.803 m)   Wt 216 lb 6.4 oz (98.2 kg)   SpO2 96%   BMI 30.18 kg/m  Wt Readings from Last 3 Encounters:  03/23/20 216 lb 6.4 oz (98.2 kg)  12/12/19 238 lb (108 kg)  09/05/19 237 lb 9.6 oz (  107.8 kg)     Health Maintenance Due  Topic Date Due  . Hepatitis C Screening  Never done  . COVID-19 Vaccine (1) Never done  . OPHTHALMOLOGY EXAM  12/22/2018  . COLONOSCOPY  Never done    There are no preventive care reminders to display for this patient.  Lab Results  Component Value Date   TSH 1.27 01/12/2018   Lab Results  Component Value Date   WBC 6.9 12/12/2019   HGB 16.3 12/12/2019   HCT 46.5 12/12/2019   MCV 86.9 12/12/2019   PLT 161.0 12/12/2019   Lab Results  Component Value Date   NA 134 (L) 12/12/2019   K 5.0 12/12/2019   CO2 24 12/12/2019   GLUCOSE 163 (H) 12/12/2019   BUN 25 (H) 12/12/2019   CREATININE 1.13 12/12/2019   BILITOT 0.8 12/12/2019   ALKPHOS 65 12/12/2019   AST 27 12/12/2019   ALT 35 12/12/2019   PROT 7.4 12/12/2019   ALBUMIN 4.6 12/12/2019   CALCIUM 9.8 12/12/2019   GFR 68.65 12/12/2019   Lab Results  Component Value Date   CHOL 152 12/12/2019   Lab Results  Component Value Date   HDL 29.00 (L) 12/12/2019   Lab Results  Component Value Date   LDLCALC 138 (H) 01/19/2019   Lab Results  Component Value Date   TRIG 234.0 (H) 12/12/2019   Lab Results  Component Value Date   CHOLHDL 5 12/12/2019   Lab Results  Component Value Date   HGBA1C 8.5 (H) 12/12/2019      Assessment & Plan:   Problem List Items Addressed This Visit      Cardiovascular and Mediastinum   Essential hypertension   Relevant Orders   Basic metabolic panel     Endocrine   Type 2 diabetes mellitus without complication, without long-term current use of insulin (HCC)   Relevant Orders   Basic metabolic panel   Hemoglobin A1c     Other   Elevated LDL cholesterol level - Primary   Relevant Orders   LDL cholesterol, direct       No orders of the defined types were placed in this encounter.   Follow-up: Return in about 3 months (around 06/22/2020), or stop amlodipine.   We have discontinued amlodipine.  He will check and record blood pressures.  Suggested that with more weight loss we could decrease the dosages of his medicines as needed. Libby Maw, MD

## 2020-03-27 ENCOUNTER — Telehealth: Payer: Self-pay | Admitting: Family Medicine

## 2020-03-27 ENCOUNTER — Encounter: Payer: Self-pay | Admitting: Family Medicine

## 2020-03-27 NOTE — Telephone Encounter (Signed)
Appointment scheduled.

## 2020-03-27 NOTE — Telephone Encounter (Signed)
Patient's wife called to request a phone call to discuss lab results. Previous messages were noted but she does not feel he needs an appointment. She would like Dr. Doreene Burke to call the patient to discuss lab results.

## 2020-03-29 ENCOUNTER — Telehealth (INDEPENDENT_AMBULATORY_CARE_PROVIDER_SITE_OTHER): Payer: 59 | Admitting: Family Medicine

## 2020-03-29 ENCOUNTER — Encounter: Payer: Self-pay | Admitting: Family Medicine

## 2020-03-29 VITALS — Ht 71.0 in | Wt 216.0 lb

## 2020-03-29 DIAGNOSIS — E7800 Pure hypercholesterolemia, unspecified: Secondary | ICD-10-CM

## 2020-03-29 DIAGNOSIS — I10 Essential (primary) hypertension: Secondary | ICD-10-CM

## 2020-03-29 DIAGNOSIS — E119 Type 2 diabetes mellitus without complications: Secondary | ICD-10-CM | POA: Diagnosis not present

## 2020-03-29 DIAGNOSIS — E1165 Type 2 diabetes mellitus with hyperglycemia: Secondary | ICD-10-CM

## 2020-03-29 MED ORDER — OLMESARTAN MEDOXOMIL-HCTZ 40-25 MG PO TABS: 1.0000 | ORAL_TABLET | Freq: Every day | ORAL | 3 refills | Status: DC

## 2020-03-29 MED ORDER — SEMAGLUTIDE (1 MG/DOSE) 2 MG/1.5ML ~~LOC~~ SOPN: 1.0000 mg | PEN_INJECTOR | SUBCUTANEOUS | 1 refills | Status: DC

## 2020-03-29 MED ORDER — METFORMIN HCL 1000 MG PO TABS: 1000.0000 mg | ORAL_TABLET | Freq: Two times a day (BID) | ORAL | 3 refills | Status: DC

## 2020-03-29 MED ORDER — ATORVASTATIN CALCIUM 20 MG PO TABS: 20.0000 mg | ORAL_TABLET | Freq: Every day | ORAL | 3 refills | Status: DC

## 2020-03-29 MED ORDER — DAPAGLIFLOZIN PROPANEDIOL 10 MG PO TABS: ORAL_TABLET | ORAL | 3 refills | Status: DC

## 2020-03-29 NOTE — Progress Notes (Signed)
Established Patient Office Visit  Subjective:  Patient ID: Eric Sellers, male    DOB: [birth year]  Age: 49 y.o. MRN: [id number]  CC:  Chief Complaint  Patient presents with  . Advice Only    discuss labs    HPI Eric Sellers presents for a follow-up discussion of his diabetes with worsening control.  Hemoglobin A1c moved from 8.5 up to 9.4.  This was despite a 22 pound weight loss with the addition of semaglutide.  Patient is an over the road truck driver and sometimes struggles to eat as healthy as he should.  His wife is present today for the video call.  She is quite supportive.  He assures me that he has been taking all of his medicines including the 0.5 of semaglutide weekly Farxiga glipizide and the Metformin 1000 twice daily.  DOT physical will be due in May.  Past Medical History:  Diagnosis Date  . Diabetes mellitus without complication ([place])   . History of kidney stones   . Hypertension     Past Surgical History:  Procedure Laterality Date  . TONSILLECTOMY      Family History  Problem Relation Age of Onset  . Diabetes Mother   . Hypertension Mother   . Diabetes Father   . Hypertension Father   . Prostate cancer Father     Social History   Socioeconomic History  . Marital status: Single    Spouse name: Not on file  . Number of children: Not on file  . Years of education: Not on file  . Highest education level: Not on file  Occupational History  . Not on file  Tobacco Use  . Smoking status: Never Smoker  . Smokeless tobacco: Never Used  Substance and Sexual Activity  . Alcohol use: Yes    Alcohol/week: 5.0 standard drinks    Types: 5 Cans of beer per week    Comment: occasional  . Drug use: No  . Sexual activity: Yes  Other Topics Concern  . Not on file  Social History Narrative  . Not on file   Social Determinants of Health   Financial Resource Strain:   . Difficulty of Paying Living Expenses: Not on file  Food Insecurity:   . Worried  About Charity fundraiser in the Last Year: Not on file  . Ran Out of Food in the Last Year: Not on file  Transportation Needs:   . Lack of Transportation (Medical): Not on file  . Lack of Transportation (Non-Medical): Not on file  Physical Activity:   . Days of Exercise per Week: Not on file  . Minutes of Exercise per Session: Not on file  Stress:   . Feeling of Stress : Not on file  Social Connections:   . Frequency of Communication with Friends and Family: Not on file  . Frequency of Social Gatherings with Friends and Family: Not on file  . Attends Religious Services: Not on file  . Active Member of Clubs or Organizations: Not on file  . Attends Archivist Meetings: Not on file  . Marital Status: Not on file  Intimate Partner Violence:   . Fear of Current or Ex-Partner: Not on file  . Emotionally Abused: Not on file  . Physically Abused: Not on file  . Sexually Abused: Not on file    Outpatient Medications Prior to Visit  Medication Sig Dispense Refill  . Blood Glucose Monitoring Suppl (CONTOUR BLOOD GLUCOSE SYSTEM) w/Device KIT Use to check  glucose TID. 1 each 0  . CONTOUR NEXT TEST test strip USE TO CHECK GLUCOSE 3 TIMES A DAY 100 strip 12  . glucose blood (ONE TOUCH ULTRA TEST) test strip 1 each by Other route 2 (two) times daily as needed for other. 100 each 5  . ONETOUCH DELICA LANCETS FINE MISC Inject 1 each into the skin 2 (two) times daily as needed. 100 each 5  . atorvastatin (LIPITOR) 20 MG tablet Take 1 tablet (20 mg total) by mouth daily. 90 tablet 3  . FARXIGA 10 MG TABS tablet TAKE 1 TABLET BY MOUTH  DAILY BEFORE BREAKFAST 90 tablet 3  . metFORMIN (GLUCOPHAGE) 1000 MG tablet Take 1 tablet (1,000 mg total) by mouth 2 (two) times daily with a meal. 180 tablet 3  . olmesartan-hydrochlorothiazide (BENICAR HCT) 40-25 MG tablet TAKE 1 TABLET BY MOUTH  DAILY 90 tablet 3  . Semaglutide,0.25 or 0.5MG/DOS, (OZEMPIC, 0.25 OR 0.5 MG/DOSE,) 2 MG/1.5ML SOPN Inject 0.375  mLs (0.5 mg total) into the skin once a week. 3 pen 3   No facility-administered medications prior to visit.    Allergies  Allergen Reactions  . Penicillins     ROS Review of Systems  Constitutional: Negative.   HENT: Negative.   Eyes: Negative for photophobia and visual disturbance.  Respiratory: Negative.   Cardiovascular: Negative.   Gastrointestinal: Negative.   Endocrine: Negative for polyphagia and polyuria.  Psychiatric/Behavioral: Negative.       Objective:    Physical Exam Constitutional:      General: He is not in acute distress. Pulmonary:     Effort: Pulmonary effort is normal.  Neurological:     Mental Status: He is oriented to person, place, and time.  Psychiatric:        Mood and Affect: Mood normal.        Behavior: Behavior normal.     Ht 5' 11" (1.803 m)   Wt 216 lb (98 kg)   BMI 30.13 kg/m  Wt Readings from Last 3 Encounters:  03/29/20 216 lb (98 kg)  03/23/20 216 lb 6.4 oz (98.2 kg)  12/12/19 238 lb (108 kg)     Health Maintenance Due  Topic Date Due  . Hepatitis C Screening  Never done  . OPHTHALMOLOGY EXAM  12/22/2018  . COLONOSCOPY  Never done    There are no preventive care reminders to display for this patient.  Lab Results  Component Value Date   TSH 1.27 01/12/2018   Lab Results  Component Value Date   WBC 6.9 12/12/2019   HGB 16.3 12/12/2019   HCT 46.5 12/12/2019   MCV 86.9 12/12/2019   PLT 161.0 12/12/2019   Lab Results  Component Value Date   NA 136 03/23/2020   K 4.2 03/23/2020   CO2 25 03/23/2020   GLUCOSE 163 (H) 03/23/2020   BUN 25 (H) 03/23/2020   CREATININE 1.28 03/23/2020   BILITOT 0.8 12/12/2019   ALKPHOS 65 12/12/2019   AST 27 12/12/2019   ALT 35 12/12/2019   PROT 7.4 12/12/2019   ALBUMIN 4.6 12/12/2019   CALCIUM 9.7 03/23/2020   GFR 59.38 (L) 03/23/2020   Lab Results  Component Value Date   CHOL 152 12/12/2019   Lab Results  Component Value Date   HDL 29.00 (L) 12/12/2019   Lab  Results  Component Value Date   LDLCALC 138 (H) 01/19/2019   Lab Results  Component Value Date   TRIG 234.0 (H) 12/12/2019   Lab Results  Component Value Date   CHOLHDL 5 12/12/2019   Lab Results  Component Value Date   HGBA1C 9.4 (H) 03/23/2020      Assessment & Plan:   Problem List Items Addressed This Visit      Cardiovascular and Mediastinum   Essential hypertension - Primary   Relevant Medications   atorvastatin (LIPITOR) 20 MG tablet   olmesartan-hydrochlorothiazide (BENICAR HCT) 40-25 MG tablet     Endocrine   Type 2 diabetes mellitus without complication, without long-term current use of insulin (HCC)   Relevant Medications   metFORMIN (GLUCOPHAGE) 1000 MG tablet   atorvastatin (LIPITOR) 20 MG tablet   dapagliflozin propanediol (FARXIGA) 10 MG TABS tablet   olmesartan-hydrochlorothiazide (BENICAR HCT) 40-25 MG tablet   Semaglutide, 1 MG/DOSE, 2 MG/1.5ML SOPN   Uncontrolled type 2 diabetes mellitus with hyperglycemia (HCC)   Relevant Medications   metFORMIN (GLUCOPHAGE) 1000 MG tablet   atorvastatin (LIPITOR) 20 MG tablet   dapagliflozin propanediol (FARXIGA) 10 MG TABS tablet   olmesartan-hydrochlorothiazide (BENICAR HCT) 40-25 MG tablet   Semaglutide, 1 MG/DOSE, 2 MG/1.5ML SOPN   Other Relevant Orders   Ambulatory referral to diabetic education     Other   Elevated LDL cholesterol level   Relevant Medications   atorvastatin (LIPITOR) 20 MG tablet      Meds ordered this encounter  Medications  . metFORMIN (GLUCOPHAGE) 1000 MG tablet    Sig: Take 1 tablet (1,000 mg total) by mouth 2 (two) times daily with a meal.    Dispense:  180 tablet    Refill:  3  . atorvastatin (LIPITOR) 20 MG tablet    Sig: Take 1 tablet (20 mg total) by mouth daily.    Dispense:  90 tablet    Refill:  3  . dapagliflozin propanediol (FARXIGA) 10 MG TABS tablet    Sig: TAKE 1 TABLET BY MOUTH  DAILY BEFORE BREAKFAST    Dispense:  90 tablet    Refill:  3    Requesting 1  year supply  . olmesartan-hydrochlorothiazide (BENICAR HCT) 40-25 MG tablet    Sig: Take 1 tablet by mouth daily.    Dispense:  90 tablet    Refill:  3    Requesting 1 year supply  . Semaglutide, 1 MG/DOSE, 2 MG/1.5ML SOPN    Sig: Inject 1 mg into the skin once a week.    Dispense:  12 mL    Refill:  1    Follow-up: Return in about 3 months (around 06/28/2020).   Patient agrees to go for diabetic teaching.  His wife will be involved with that.  Have increased the semaglutide to 1 mg/week.  Continue all current medicines.  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 Libby Maw, MD   Virtual Visit via Video Note  I connected with Dola Factor on 03/29/20 at 11:30 AM EDT by a video enabled telemedicine application and verified that I am speaking with the correct person using two identifiers.  Location: Patient: on  [place] via cell phone. Wife was also present. She is at home.  Provider:    I discussed the limitations of evaluation and management by telemedicine and the availability of in person appointments. The patient expressed understanding and agreed to proceed.  History of Present Illness:    Observations/Objective:   Assessment and Plan:   Follow Up Instructions:    I discussed the assessment and treatment plan with the  patient. The patient was provided an opportunity to ask questions and all were answered. The patient agreed with the plan and demonstrated an understanding of the instructions.   The patient was advised to call back or seek an in-person evaluation if the symptoms worsen or if the condition fails to improve as anticipated.  I provided no I am good thank you Terry25 minutes of non-face-to-face time during this encounter.   Libby Maw, MD

## 2020-03-30 ENCOUNTER — Encounter: Payer: Self-pay | Admitting: Dietician

## 2020-03-30 ENCOUNTER — Encounter: Payer: 59 | Attending: Family Medicine | Admitting: Dietician

## 2020-03-30 ENCOUNTER — Other Ambulatory Visit: Payer: Self-pay

## 2020-03-30 DIAGNOSIS — E119 Type 2 diabetes mellitus without complications: Secondary | ICD-10-CM

## 2020-03-30 NOTE — Progress Notes (Signed)
Diabetes Self-Management Education  Visit Type: First/Initial  Appt. Start Time: 1015 Appt. End Time: 1130  03/30/2020  [name], identified by name and date of birth, is a 49 y.o. male with a diagnosis of Diabetes: Type 2.   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 Pt is very sedentary during his work day. Hours are 4 am - 5 pm Pt eats breakfast around 3 in the morning, and may not eat again between 7 am - 5 pm. Pt states that he is making good food choices already. Pt was doing well before COVID, A1c around 7. Pt would go to the gym 5 times a week. Pt has lost 20 pounds in the last three months. States that he has begun exercising more again. Pt reports going to [employer]. Enjoys Friday morning classes. Reports portion size is his biggest problem. Pt reports not eating vegetables. States he will eat them in soups and stews his partner cooks. Pt checks BG after work. Usually sees around 190. Pt reports he is contemplating making a change in employment due to concerns for his health.   There were no vitals taken for this visit. There is no height or weight on file to calculate BMI.   Diabetes Self-Management Education - 03/30/20 1033      Visit Information   Visit Type First/Initial      Initial Visit   Diabetes Type Type 2    Are you currently following a meal plan? No    Are you taking your medications as prescribed? Yes    Date Diagnosed 20 years      Health Coping   How would you rate your overall health? Good      Psychosocial Assessment   Patient Belief/Attitude about Diabetes Defeat/Burnout    Self-care barriers None    Self-management support Doctor's office    Other persons present Patient    Patient Concerns Nutrition/Meal planning    Special Needs None    Preferred Learning Style No preference indicated    Learning Readiness Ready    How often do you need to have  someone help you when you read instructions, pamphlets, or other written materials from your doctor or pharmacy? 3 - Sometimes    What is the last grade level you completed in school? 12th grade      Pre-Education Assessment   Patient understands the diabetes disease and treatment process. Needs Instruction    Patient understands incorporating nutritional management into lifestyle. Needs Instruction    Patient undertands incorporating physical activity into lifestyle. Needs Instruction    Patient understands using medications safely. Needs Instruction    Patient understands monitoring blood glucose, interpreting and using results Needs Instruction    Patient understands prevention, detection, and treatment of acute complications. Needs Instruction    Patient understands prevention, detection, and treatment of chronic complications. Needs Instruction    Patient understands how to develop strategies to address psychosocial issues. Needs Instruction    Patient understands how to develop strategies to promote health/change behavior. Needs Instruction      Complications   Last HgB A1C per patient/outside source 9.4 %   03/23/2020   How often do you check your blood sugar? 1-2 times/day    Fasting Blood glucose range (mg/dL) --   Not checking fasting   Postprandial Blood glucose range (mg/dL) 671-245    Number of hypoglycemic episodes per month  0    Number of hyperglycemic episodes per week 0    Have you had a dilated eye exam in the past 12 months? Yes    Have you had a dental exam in the past 12 months? Yes    Are you checking your feet? Yes    How many days per week are you checking your feet? 1      Exercise   Exercise Type ADL's;Moderate (swimming / aerobic walking)    How many days per week to you exercise? 3    How many minutes per day do you exercise? 60    Total minutes per week of exercise 180      Patient Education   Previous Diabetes Education No    Nutrition management   Carbohydrate counting;Food label reading, portion sizes and measuring food.;Role of diet in the treatment of diabetes and the relationship between the three main macronutrients and blood glucose level;Reviewed blood glucose goals for pre and post meals and how to evaluate the patients' food intake on their blood glucose level.    Physical activity and exercise  Role of exercise on diabetes management, blood pressure control and cardiac health.    Medications Reviewed patients medication for diabetes, action, purpose, timing of dose and side effects.    Monitoring Purpose and frequency of SMBG.;Identified appropriate SMBG and/or A1C goals.    Chronic complications Relationship between chronic complications and blood glucose control    Psychosocial adjustment Travel strategies    Personal strategies to promote health Lifestyle issues that need to be addressed for better diabetes care   Not going 8-10 hours without eating     Individualized Goals (developed by patient)   Nutrition Follow meal plan discussed    Physical Activity Exercise 5-7 days per week    Medications take my medication as prescribed    Monitoring  test my blood glucose as discussed      Post-Education Assessment   Patient understands the diabetes disease and treatment process. Needs Review   Pt showed quick understanding of pathophysiology of DM   Patient understands incorporating nutritional management into lifestyle. Needs Review    Patient undertands incorporating physical activity into lifestyle. Needs Review    Patient understands using medications safely. Needs Review    Patient understands monitoring blood glucose, interpreting and using results Needs Review    Patient understands prevention, detection, and treatment of acute complications. Needs Review    Patient understands prevention, detection, and treatment of chronic complications. Needs Review    Patient understands how to develop strategies to address psychosocial  issues. Needs Review    Patient understands how to develop strategies to promote health/change behavior. Needs Review      Outcomes   Expected Outcomes Demonstrated interest in learning. Expect positive outcomes    Future DMSE 4-6 wks    Program Status Not Completed           Individualized Plan for Diabetes Self-Management Training:   Learning Objective:  Patient will have a greater understanding of diabetes self-management. Patient education plan is to attend individual and/or group sessions per assessed needs and concerns.   Plan:   Patient Instructions  Check Blood sugar two times a day Once fasting, first thing in the morning Once 2 hours after you begin to eat a meal  Eat 3 meals a day! Have 3 carb choices at each meal (45g) Avoid saturated fats (sausage, bacon, grease)  Keep up the physical activity!  Work towards balanced  meals of starches, protein, and non starchy vegetable!    Expected Outcomes:  Demonstrated interest in learning. Expect positive outcomes  Education material provided: ADA - How to Thrive: A Guide for Your Journey with Diabetes, Meal plan card and My Plate  If problems or questions, patient to contact team via:  Phone and Email  Future DSME appointment: 4-6 wks

## 2020-03-30 NOTE — Patient Instructions (Addendum)
Check Blood sugar two times a day Once fasting, first thing in the morning Once 2 hours after you begin to eat a meal  Eat 3 meals a day! Have 3 carb choices at each meal (45g) Avoid saturated fats (sausage, bacon, grease)  Keep up the physical activity!  Work towards balanced meals of starches, protein, and non starchy vegetable!

## 2020-04-03 ENCOUNTER — Other Ambulatory Visit: Payer: Self-pay

## 2020-05-03 ENCOUNTER — Encounter: Payer: Self-pay | Admitting: Dietician

## 2020-05-03 ENCOUNTER — Encounter: Payer: 59 | Attending: Family Medicine | Admitting: Dietician

## 2020-05-03 ENCOUNTER — Other Ambulatory Visit: Payer: Self-pay

## 2020-05-03 DIAGNOSIS — E119 Type 2 diabetes mellitus without complications: Secondary | ICD-10-CM | POA: Diagnosis present

## 2020-05-03 NOTE — Patient Instructions (Addendum)
Consider more non-perishable food items to take with you to work!  Continue 3 carb choices at each meal, and 1 carb choice for a snack. Consider having 2 carb choices at each meal if you eat 5 meals a day! Whatever you do, make sure to get consistent carbs throughout the day!  Pay attention to how your clothes fit!  Enjoy your trip to [place]!!!!  Keep up the good work!

## 2020-05-03 NOTE — Progress Notes (Signed)
Diabetes Self-Management Education  Visit Type: Follow-up  Appt. Start Time: 1635 Appt. End Time: 1710  05/03/2020  [name], identified by name and date of birth, is a 49 y.o. male with a diagnosis of Diabetes: Type 2.   ASSESSMENT  Pt is present with his wife for the appointment.   Pt reports losing weight, self-reported weight at 202 lbs. Lost 8-10 pounds in the last month. FBG is usually around 150-160 PPBG is usually around 125-145 Pt reports sometimes not getting enough carbs during lunch, and over consuming during dinner. States he is trying to eat 3 small meals while at work (12 hour work day),  Pt reports difficulty carrying enough food with him while working due to keeping foods cold/space in his cooler. Pt is still doing [employer] fitness 3-4 days a week for an hour.  There were no vitals taken for this visit. There is no height or weight on file to calculate BMI.   Diabetes Self-Management Education - 05/03/20 1924      Visit Information   Visit Type Follow-up      Initial Visit   Diabetes Type Type 2    Are you currently following a meal plan? Yes      Exercise   Exercise Type ADL's;Strenuous (running)    How many days per week to you exercise? 4    How many minutes per day do you exercise? 60    Total minutes per week of exercise 240      Individualized Goals (developed by patient)   Nutrition Follow meal plan discussed;General guidelines for healthy choices and portions discussed    Physical Activity Exercise 3-5 times per week    Medications take my medication as prescribed      Patient Self-Evaluation of Goals - Patient rates self as meeting previously set goals (% of time)   Nutrition 50 - 75 %    Physical Activity >75%    Medications >75%    Monitoring >75%    Problem Solving 50 - 75 %    Reducing Risk 50 - 75 %    Health Coping 50 - 75 %      Post-Education Assessment   Patient understands the diabetes disease and treatment  process. Needs Review    Patient understands incorporating nutritional management into lifestyle. Needs Review    Patient undertands incorporating physical activity into lifestyle. Needs Review    Patient understands using medications safely. Needs Review    Patient understands monitoring blood glucose, interpreting and using results Needs Review    Patient understands prevention, detection, and treatment of acute complications. Needs Review    Patient understands prevention, detection, and treatment of chronic complications. Needs Review    Patient understands how to develop strategies to address psychosocial issues. Needs Review    Patient understands how to develop strategies to promote health/change behavior. Needs Review      Outcomes   Expected Outcomes Demonstrated interest in learning. Expect positive outcomes    Future DMSE 2 months    Program Status Not Completed      Subsequent Visit   Since your last visit have you continued or begun to take your medications as prescribed? Yes    Since your last visit have you had your blood pressure checked? No    Since your last visit have you experienced any weight changes? Loss    Weight Loss (lbs) 10    Since your last visit, are you checking your blood glucose  at least once a day? No           Individualized Plan for Diabetes Self-Management Training:   Learning Objective:  Patient will have a greater understanding of diabetes self-management. Patient education plan is to attend individual and/or group sessions per assessed needs and concerns.   Plan:   Patient Instructions  Consider more non-perishable food items to take with you to work!  Continue 3 carb choices at each meal, and 1 carb choice for a snack. Consider having 2 carb choices at each meal if you eat 5 meals a day! Whatever you do, make sure to get consistent carbs throughout the day!  Pay attention to how your clothes fit!  Enjoy your trip to [place]!!!!  Keep  up the good work!   Expected Outcomes:  Demonstrated interest in learning. Expect positive outcomes  Education material provided: My Plate and Food list  If problems or questions, patient to contact team via:  Phone and Email  Future DSME appointment: 2 months
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
## 2020-06-22 ENCOUNTER — Ambulatory Visit: Payer: 59 | Admitting: Family Medicine

## 2020-07-12 ENCOUNTER — Encounter: Payer: 59 | Attending: Family Medicine | Admitting: Dietician

## 2020-07-12 ENCOUNTER — Other Ambulatory Visit: Payer: Self-pay

## 2020-07-12 ENCOUNTER — Encounter: Payer: Self-pay | Admitting: Dietician

## 2020-07-12 DIAGNOSIS — E1165 Type 2 diabetes mellitus with hyperglycemia: Secondary | ICD-10-CM | POA: Insufficient documentation

## 2020-07-12 DIAGNOSIS — E119 Type 2 diabetes mellitus without complications: Secondary | ICD-10-CM

## 2020-07-12 NOTE — Progress Notes (Signed)
Diabetes Self-Management Education  Visit Type: Follow-up  Appt. Start Time: 1645 Appt. End Time: 1730  07/12/2020  [name], identified by name and date of birth, is a 50 y.o. male with a diagnosis of Diabetes: Type 2.   ASSESSMENT Pt states he has lost 13 pounds in the last 2 months. Pt states his fasting is still 140-160.  States he is still in the 160s 2 hours after eating. Pt states that when he goes to the gym, he gets dizzy while doing a lot of up and down exercises. States he thinks his blood pressure is getting low, pt checks his BP and its 80/60. Pt will be contacting his PCP about adjusting his dosage of hydrochlorothiazide tomorrow. Pt states that his blood sugar is usually very high after working out, states it is nearly 200. Pt states sometimes he has only 1 carb choice before his workout.  Pt reports having a bowl of fruit before bed instead of ice cream.  There were no vitals taken for this visit. There is no height or weight on file to calculate BMI.   Diabetes Self-Management Education - 07/12/20 1700      Visit Information   Visit Type Follow-up      Initial Visit   Diabetes Type Type 2    Are you currently following a meal plan? Yes    Are you taking your medications as prescribed? Yes      Complications   Fasting Blood glucose range (mg/dL) 454-098    Postprandial Blood glucose range (mg/dL) 119-147      Exercise   Exercise Type ADL's;Strenuous (running)    How many days per week to you exercise? 4    How many minutes per day do you exercise? 60    Total minutes per week of exercise 240      Individualized Goals (developed by patient)   Nutrition Follow meal plan discussed;Adjust meds/carbs with exercise as discussed    Physical Activity Exercise 3-5 times per week    Medications take my medication as prescribed    Monitoring  test my blood glucose as discussed      Patient Self-Evaluation of Goals - Patient rates self as meeting previously  set goals (% of time)   Nutrition 50 - 75 %    Physical Activity >75%    Medications >75%    Monitoring 50 - 75 %    Problem Solving 50 - 75 %    Reducing Risk 25 - 50%    Health Coping 50 - 75 %      Outcomes   Expected Outcomes Demonstrated interest in learning. Expect positive outcomes    Future DMSE 2 months    Program Status Not Completed      Subsequent Visit   Since your last visit have you continued or begun to take your medications as prescribed? Yes    Since your last visit have you had your blood pressure checked? Yes    Is your most recent blood pressure lower, unchanged, or higher since your last visit? Lower    Since your last visit have you experienced any weight changes? Loss    Weight Loss (lbs) 12    Since your last visit, are you checking your blood glucose at least once a day? Yes           Individualized Plan for Diabetes Self-Management Training:   Learning Objective:  Patient will have a greater understanding of diabetes self-management. Patient education  plan is to attend individual and/or group sessions per assessed needs and concerns.   Plan:   Patient Instructions  Eat 4 carb choices before your workouts, about an hour and a half before you start your workout. Have an 8 oz glass of chocolate milk within 30 minutes of finishing your workout.  Consider having 1 small Gatorade Zero, sunflower seeds or pickle juice after your workout to try to aid in keeping your blood pressure from falling.  Have a 1/2 cup of berries with fat-free greek yogurt for an evening snack, and see how it affects your fasting blood sugar.  Check your blood sugar before and after you workout to see if your change in diet is helping to control the spikes.   Expected Outcomes:  Demonstrated interest in learning. Expect positive outcomes   If problems or questions, patient to contact team via:  Phone and Email  Future DSME appointment: 2 months

## 2020-07-12 NOTE — Patient Instructions (Addendum)
Eat 4 carb choices before your workouts, about an hour and a half before you start your workout. Have an 8 oz glass of chocolate milk within 30 minutes of finishing your workout.  Consider having 1 small Gatorade Zero, sunflower seeds or pickle juice after your workout to try to aid in keeping your blood pressure from falling.  Have a 1/2 cup of berries with fat-free greek yogurt for an evening snack, and see how it affects your fasting blood sugar.  Check your blood sugar before and after you workout to see if your change in diet is helping to control the spikes.

## 2020-07-13 ENCOUNTER — Encounter: Payer: Self-pay | Admitting: Nurse Practitioner

## 2020-07-13 ENCOUNTER — Ambulatory Visit: Payer: 59 | Admitting: Nurse Practitioner

## 2020-07-13 VITALS — BP 100/60 | HR 96 | Temp 97.8°F | Ht 71.0 in | Wt 201.0 lb

## 2020-07-13 DIAGNOSIS — I951 Orthostatic hypotension: Secondary | ICD-10-CM | POA: Diagnosis not present

## 2020-07-13 DIAGNOSIS — I10 Essential (primary) hypertension: Secondary | ICD-10-CM | POA: Diagnosis not present

## 2020-07-13 DIAGNOSIS — E1165 Type 2 diabetes mellitus with hyperglycemia: Secondary | ICD-10-CM | POA: Diagnosis not present

## 2020-07-13 NOTE — Assessment & Plan Note (Addendum)
Home fasting glucose 150-170. Reports improved glucose controlled under guidance of nutritionist, medication and exercise (orange theory 4x/week-1hr each day) Denies any adverse side effects with metformin, farxiga and ozempic. No LUTS and no groin rash. Lost 15lbs since 03/2020.  Repeat BMP and HgbA1c today Maintain current dose

## 2020-07-13 NOTE — Assessment & Plan Note (Signed)
Positive orthostatic BP today: 128/72, 110/70, 106/72 Reports lightheadedness with head below heart level or rapid movement from supine/sitting position. States he does maintains adequate oral hydration. BP Readings from Last 3 Encounters:  07/13/20 100/60  03/23/20 118/66  12/12/19 118/68   Repeat BMP Hold benicar/HCTZ Monitor BP at home. F/up in 2weeks

## 2020-07-13 NOTE — Patient Instructions (Addendum)
Due to orthostatic hypotension, stop benicar/HCTZ Monitor BP daily at home in AM Call office if BP>130/80  Stable renal function,  Continue to hold benicar/HCTZ and monitor BP Improve HgbA1c: 9.4 to 7.5 Maintain other medications

## 2020-07-13 NOTE — Progress Notes (Signed)
Subjective:  Patient ID: Eric Sellers, male    DOB: 1971/02/13  Age: 50 y.o. MRN: [id number]  CC: Follow-up (3 month f/u on HTN and DM. )  HPI  Uncontrolled type 2 diabetes mellitus with hyperglycemia (HCC) Home fasting glucose 150-170. Reports improved glucose controlled under guidance of nutritionist, medication and exercise (orange theory 4x/week-1hr each day) Denies any adverse side effects with metformin, farxiga and ozempic. No LUTS and no groin rash. Lost 15lbs since 03/2020.  Repeat BMP and HgbA1c today Maintain current dose  Essential hypertension Positive orthostatic BP today: 128/72, 110/70, 106/72 Reports lightheadedness with head below heart level or rapid movement from supine/sitting position. States he does maintains adequate oral hydration. BP Readings from Last 3 Encounters:  07/13/20 100/60  03/23/20 118/66  12/12/19 118/68   Repeat BMP Hold benicar/HCTZ Monitor BP at home. F/up in 2weeks  Wt Readings from Last 3 Encounters:  07/13/20 201 lb (91.2 kg)  03/29/20 216 lb (98 kg)  03/23/20 216 lb 6.4 oz (98.2 kg)    Reviewed past Medical, Social and Family history today.  Outpatient Medications Prior to Visit  Medication Sig Dispense Refill  . atorvastatin (LIPITOR) 20 MG tablet Take 1 tablet (20 mg total) by mouth daily. 90 tablet 3  . Blood Glucose Monitoring Suppl (CONTOUR BLOOD GLUCOSE SYSTEM) w/Device KIT Use to check glucose TID. 1 each 0  . CONTOUR NEXT TEST test strip USE TO CHECK GLUCOSE 3 TIMES A DAY 100 strip 12  . dapagliflozin propanediol (FARXIGA) 10 MG TABS tablet TAKE 1 TABLET BY MOUTH  DAILY BEFORE BREAKFAST 90 tablet 3  . glucose blood (ONE TOUCH ULTRA TEST) test strip 1 each by Other route 2 (two) times daily as needed for other. 100 each 5  . metFORMIN (GLUCOPHAGE) 1000 MG tablet Take 1 tablet (1,000 mg total) by mouth 2 (two) times daily with a meal. 180 tablet 3  . olmesartan-hydrochlorothiazide (BENICAR HCT) 40-25 MG tablet Take  1 tablet by mouth daily. 90 tablet 3  . ONETOUCH DELICA LANCETS FINE MISC Inject 1 each into the skin 2 (two) times daily as needed. 100 each 5  . Semaglutide, 1 MG/DOSE, 2 MG/1.5ML SOPN Inject 1 mg into the skin once a week. 12 mL 1   No facility-administered medications prior to visit.    ROS See HPI  Objective:  BP 100/60 (BP Location: Left Arm, Patient Position: Sitting, Cuff Size: Large)   Pulse 96   Temp 97.8 F (36.6 C) (Temporal)   Ht 5' 11" (1.803 m)   Wt 201 lb (91.2 kg)   SpO2 98%   BMI 28.03 kg/m   Physical Exam Cardiovascular:     Rate and Rhythm: Normal rate and regular rhythm.     Pulses: Normal pulses.     Heart sounds: Normal heart sounds.  Pulmonary:     Effort: Pulmonary effort is normal.     Breath sounds: Normal breath sounds.  Musculoskeletal:     Right lower leg: No edema.     Left lower leg: No edema.  Neurological:     Mental Status: He is alert and oriented to person, place, and time.  Psychiatric:        Mood and Affect: Mood normal.        Behavior: Behavior normal.        Thought Content: Thought content normal.     Assessment & Plan:  This visit occurred during the SARS-CoV-2 public health emergency.  Safety protocols were in place,  including screening questions prior to the visit, additional usage of staff PPE, and extensive cleaning of exam room while observing appropriate contact time as indicated for disinfecting solutions.   Corrie was seen today for follow-up.  Diagnoses and all orders for this visit:  Type 2 diabetes mellitus with hyperglycemia, without long-term current use of insulin (HCC) -     Hemoglobin A1c  Orthostatic hypotension  Essential hypertension -     Basic metabolic panel   Problem List Items Addressed This Visit      Cardiovascular and Mediastinum   Essential hypertension    Positive orthostatic BP today: 128/72, 110/70, 106/72 Reports lightheadedness with head below heart level or rapid movement from  supine/sitting position. States he does maintains adequate oral hydration. BP Readings from Last 3 Encounters:  07/13/20 100/60  03/23/20 118/66  12/12/19 118/68   Repeat BMP Hold benicar/HCTZ Monitor BP at home. F/up in 2weeks      Relevant Orders   Basic metabolic panel     Endocrine   Uncontrolled type 2 diabetes mellitus with hyperglycemia ([place]) - Primary    Home fasting glucose 150-170. Reports improved glucose controlled under guidance of nutritionist, medication and exercise (orange theory 4x/week-1hr each day) Denies any adverse side effects with metformin, farxiga and ozempic. No LUTS and no groin rash. Lost 15lbs since 03/2020.  Repeat BMP and HgbA1c today Maintain current dose       Other Visit Diagnoses    Orthostatic hypotension          Follow-up: Return in about 2 weeks (around 07/27/2020) for HTN.  Wilfred Lacy, NP

## 2020-07-14 LAB — BASIC METABOLIC PANEL
BUN/Creatinine Ratio: 25 (calc) — ABNORMAL HIGH (ref 6–22)
BUN: 29 mg/dL — ABNORMAL HIGH (ref 7–25)
CO2: 25 mmol/L (ref 20–32)
Calcium: 9.9 mg/dL (ref 8.6–10.3)
Chloride: 97 mmol/L — ABNORMAL LOW (ref 98–110)
Creat: 1.16 mg/dL (ref 0.70–1.33)
Glucose, Bld: 136 mg/dL — ABNORMAL HIGH (ref 65–99)
Potassium: 4 mmol/L (ref 3.5–5.3)
Sodium: 137 mmol/L (ref 135–146)

## 2020-07-14 LAB — HEMOGLOBIN A1C
Hgb A1c MFr Bld: 7.5 % of total Hgb — ABNORMAL HIGH (ref <5.7)
Mean Plasma Glucose: 169 mg/dL
eAG (mmol/L): 9.3 mmol/L

## 2020-07-26 ENCOUNTER — Other Ambulatory Visit: Payer: Self-pay

## 2020-07-27 ENCOUNTER — Encounter: Payer: Self-pay | Admitting: Nurse Practitioner

## 2020-07-27 ENCOUNTER — Telehealth (INDEPENDENT_AMBULATORY_CARE_PROVIDER_SITE_OTHER): Payer: 59 | Admitting: Nurse Practitioner

## 2020-07-27 VITALS — BP 135/79 | Ht 71.0 in | Wt 202.0 lb

## 2020-07-27 DIAGNOSIS — I10 Essential (primary) hypertension: Secondary | ICD-10-CM | POA: Diagnosis not present

## 2020-07-27 MED ORDER — HYDROCHLOROTHIAZIDE 25 MG PO TABS: 12.5000 mg | ORAL_TABLET | Freq: Every day | ORAL | 1 refills | Status: DC | PRN

## 2020-07-27 NOTE — Progress Notes (Signed)
Virtual Visit via Video Note  I connected with@ on 07/27/20 at  2:00 PM EST by a video enabled telemedicine application and verified that I am speaking with the correct person using two identifiers.  Location: Patient:Home Provider: Office Participants: patient, wife and provider  I discussed the limitations of evaluation and management by telemedicine and the availability of in person appointments. I also discussed with the patient that there may be a patient responsible charge related to this service. The patient expressed understanding and agreed to proceed.  CC:HTN f/up  History of Present Illness: Essential hypertension Resolved dizziness. No headache Home BP 130s/70s Reports ankle edema with prolonged standing with use of steel toed boots. Edema improves with elevation. D/c benicar/HCTZ Continue to monitor BP 2-3x/week in AM Use HCTZ 12.5mg  dailyprn for LE edema and if BP>140/80. Call office if BP >140/80 x 2days. F/up with Dr. Doreene Burke in 5months  BP Readings from Last 3 Encounters:  07/27/20 135/79  07/13/20 100/60  03/23/20 118/66   Observations/Objective: Physical Exam Cardiovascular:     Pulses: Normal pulses.  Pulmonary:     Effort: Pulmonary effort is normal.  Neurological:     Mental Status: He is alert and oriented to person, place, and time.    Assessment and Plan: Eric Sellers was seen today for follow-up.  Diagnoses and all orders for this visit:  Essential hypertension -     Discontinue: hydrochlorothiazide (HYDRODIURIL) 25 MG tablet; Take 0.5 tablets (12.5 mg total) by mouth daily as needed (for ankle edema and/or BP>140/80). -     hydrochlorothiazide (HYDRODIURIL) 25 MG tablet; Take 0.5 tablets (12.5 mg total) by mouth daily as needed (for ankle edema and/or BP>140/80).   Follow Up Instructions: See above   I discussed the assessment and treatment plan with the patient. The patient was provided an opportunity to ask questions and all were answered. The  patient agreed with the plan and demonstrated an understanding of the instructions.   The patient was advised to call back or seek an in-person evaluation if the symptoms worsen or if the condition fails to improve as anticipated.  [name], NP

## 2020-07-27 NOTE — Patient Instructions (Signed)
D/c benicar/HCTZ Continue to monitor BP 2-3x/week in AM Use HCTZ 12.5mg  dailyprn for LE edema and if BP>140/80. Call office if BP >140/80 x 2days. F/up with Dr. Doreene Burke in 7months

## 2020-07-27 NOTE — Assessment & Plan Note (Signed)
Resolved dizziness. No headache Home BP 130s/70s Reports ankle edema with prolonged standing with use of steel toed boots. Edema improves with elevation. D/c benicar/HCTZ Continue to monitor BP 2-3x/week in AM Use HCTZ 12.5mg  dailyprn for LE edema and if BP>140/80. Call office if BP >140/80 x 2days. F/up with Dr. Doreene Burke in 27months

## 2020-08-15 ENCOUNTER — Other Ambulatory Visit: Payer: Self-pay | Admitting: Family Medicine

## 2020-09-06 ENCOUNTER — Encounter: Payer: Self-pay | Admitting: Dietician

## 2020-09-06 ENCOUNTER — Other Ambulatory Visit: Payer: Self-pay

## 2020-09-06 ENCOUNTER — Encounter: Payer: 59 | Attending: Family Medicine | Admitting: Dietician

## 2020-09-06 DIAGNOSIS — E119 Type 2 diabetes mellitus without complications: Secondary | ICD-10-CM

## 2020-09-06 NOTE — Progress Notes (Signed)
Diabetes Self-Management Education  Visit Type:    Appt. Start Time: 1615 Appt. End Time: 1650  09/06/2020  [name], identified by name and date of birth, is a 50 y.o. male with a diagnosis of Diabetes:  .   ASSESSMENT  Pt reports weight is 193. Last recorded weight was 202 on 07/27/2020 at doctors office.  Pt reports drinking chocolate milk after their workout, and having more than 1 carb choice before their workout. Pt states their BG after workouts has gone down since starting this. FBG - ~130-135 CBG - ~140  Pt will be having their A1c taken on 09/14/2020 Pt reports occasional FBG of upper 100's, but knows that it was due to a higher fat and sugar food they ate the night before. Pt reports still doing [employer] for an hour 3-4 times a week.  Pt reports making better food choices, and packing lunches more than before.  Pt still reports difficulty getting in more vegetables.  Pt reports their PCP took them off of the hydrochlorothiazide due to being able to bring their blood pressure down.   Breakfast: Banana, yogurt, cereal bar Snack: 2 wafer cookies Lunch: Bologna Sandwich, fruit cup Snack: Protein shake Dinner: Chicken, ham, cheese casserole, bread crumbs Snack: Raisins, 2 small sugar free Hershey's bar   Individualized Plan for Diabetes Self-Management Training:   Learning Objective:  Patient will have a greater understanding of diabetes self-management. Patient education plan is to attend individual and/or group sessions per assessed needs and concerns.   Plan:   Continue to eat to fuel your body for your workouts, and recover your body afterwards!  Pack a little extra snack of nuts and fruit in case you get hungry during the day.  Try making mashed cauliflower, or cauliflower steaks to increase your non starchy vegetables.   Expected Outcomes:  Follow up with RDN PRN   If problems or questions, patient to contact team via:  Phone and  Email  Future DSME appointment:

## 2020-09-06 NOTE — Patient Instructions (Addendum)
Continue to eat to fuel your body for your workouts, and recover your body afterwards!  Pack a little extra snack of nuts and fruit in case you get hungry during the day.  Try making mashed cauliflower, or cauliflower steaks to increase your non starchy vegetables.

## 2020-09-14 ENCOUNTER — Ambulatory Visit: Payer: 59 | Admitting: Family Medicine

## 2020-09-14 ENCOUNTER — Other Ambulatory Visit: Payer: Self-pay

## 2020-09-14 ENCOUNTER — Encounter: Payer: Self-pay | Admitting: Family Medicine

## 2020-09-14 VITALS — BP 118/78 | HR 68 | Temp 97.9°F | Ht 71.0 in | Wt 198.6 lb

## 2020-09-14 DIAGNOSIS — E7800 Pure hypercholesterolemia, unspecified: Secondary | ICD-10-CM | POA: Diagnosis not present

## 2020-09-14 DIAGNOSIS — E119 Type 2 diabetes mellitus without complications: Secondary | ICD-10-CM

## 2020-09-14 LAB — URINALYSIS, ROUTINE W REFLEX MICROSCOPIC
Bilirubin Urine: NEGATIVE
Hgb urine dipstick: NEGATIVE
Leukocytes,Ua: NEGATIVE
Nitrite: NEGATIVE
RBC / HPF: NONE SEEN (ref 0–?)
Specific Gravity, Urine: 1.015 (ref 1.000–1.030)
Total Protein, Urine: NEGATIVE
Urine Glucose: >=1000 — AB
Urobilinogen, UA: 0.2 (ref 0.0–1.0)
pH: 5.5 (ref 5.0–8.0)

## 2020-09-14 LAB — CBC
HCT: 46.8 % (ref 39.0–52.0)
Hemoglobin: 16.3 g/dL (ref 13.0–17.0)
MCHC: 34.8 g/dL (ref 30.0–36.0)
MCV: 85.2 fl (ref 78.0–100.0)
Platelets: 165 10*3/uL (ref 150.0–400.0)
RBC: 5.49 Mil/uL (ref 4.22–5.81)
RDW: 13.1 % (ref 11.5–15.5)
WBC: 6.4 10*3/uL (ref 4.0–10.5)

## 2020-09-14 LAB — COMPREHENSIVE METABOLIC PANEL
ALT: 24 U/L (ref 0–53)
AST: 19 U/L (ref 0–37)
Albumin: 4.3 g/dL (ref 3.5–5.2)
Alkaline Phosphatase: 94 U/L (ref 39–117)
BUN: 17 mg/dL (ref 6–23)
CO2: 28 mEq/L (ref 19–32)
Calcium: 9.6 mg/dL (ref 8.4–10.5)
Chloride: 102 mEq/L (ref 96–112)
Creatinine, Ser: 0.92 mg/dL (ref 0.40–1.50)
GFR: 96.71 mL/min (ref >60.00)
Glucose, Bld: 137 mg/dL — ABNORMAL HIGH (ref 70–99)
Potassium: 4.3 mEq/L (ref 3.5–5.1)
Sodium: 138 mEq/L (ref 135–145)
Total Bilirubin: 1.2 mg/dL (ref 0.2–1.2)
Total Protein: 7 g/dL (ref 6.0–8.3)

## 2020-09-14 LAB — LIPID PANEL
Cholesterol: 93 mg/dL (ref 0–200)
HDL: 32.1 mg/dL — ABNORMAL LOW (ref >39.00)
LDL Cholesterol: 44 mg/dL (ref 0–99)
NonHDL: 61.21
Total CHOL/HDL Ratio: 3
Triglycerides: 86 mg/dL (ref 0.0–149.0)
VLDL: 17.2 mg/dL (ref 0.0–40.0)

## 2020-09-14 LAB — HEMOGLOBIN A1C: Hgb A1c MFr Bld: 6.5 % (ref 4.6–6.5)

## 2020-09-14 NOTE — Progress Notes (Signed)
Established Patient Office Visit  Subjective:  Patient ID: Eric Sellers, male    DOB: 1971-02-01  Age: 50 y.o. MRN: [id number]  CC:  Chief Complaint  Patient presents with  . Follow-up    Pt is here for follow up in regards to bp and diabetes     HPI Eric Sellers presents for follow-up of diabetes and hypertension.  Continues to lose some weight workout 3 times a week.  He has been able to discontinue his blood pressure medicines altogether blood pressure remains in the 120/70 range.  Doing well with all of his diabetes medicine.  He did go for dietary consultation.  Check will be coming up soon.  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  He is fasting today.  Past Medical History:  Diagnosis Date  . Diabetes mellitus without complication ([place])   . History of kidney stones   . Hypertension     Past Surgical History:  Procedure Laterality Date  . TONSILLECTOMY      Family History  Problem Relation Age of Onset  . Diabetes Mother   . Hypertension Mother   . Diabetes Father   . Hypertension Father   . Prostate cancer Father     Social History   Socioeconomic History  . Marital status: Single    Spouse name: Not on file  . Number of children: Not on file  . Years of education: Not on file  . Highest education level: Not on file  Occupational History  . Not on file  Tobacco Use  . Smoking status: Never Smoker  . Smokeless tobacco: Never Used  Substance and Sexual Activity  . Alcohol use: Yes    Alcohol/week: 5.0 standard drinks    Types: 5 Cans of beer per week    Comment: occasional  . Drug use: No  . Sexual activity: Yes  Other Topics Concern  . Not on file  Social History Narrative  . Not on file   Social Determinants of Health   Financial Resource Strain: Not on file  Food Insecurity: Not on file  Transportation Needs: Not on file  Physical Activity: Not on file  Stress: Not on file  Social Connections: Not on file   Intimate Partner Violence: Not on file    Outpatient Medications Prior to Visit  Medication Sig Dispense Refill  . atorvastatin (LIPITOR) 20 MG tablet Take 1 tablet (20 mg total) by mouth daily. 90 tablet 3  . Blood Glucose Monitoring Suppl (CONTOUR BLOOD GLUCOSE SYSTEM) w/Device KIT Use to check glucose TID. 1 each 0  . CONTOUR NEXT TEST test strip USE TO CHECK GLUCOSE 3 TIMES A DAY 100 strip 12  . dapagliflozin propanediol (FARXIGA) 10 MG TABS tablet TAKE 1 TABLET BY MOUTH  DAILY BEFORE BREAKFAST 90 tablet 3  . glucose blood (ONE TOUCH ULTRA TEST) test strip 1 each by Other route 2 (two) times daily as needed for other. 100 each 5  . metFORMIN (GLUCOPHAGE) 1000 MG tablet Take 1 tablet (1,000 mg total) by mouth 2 (two) times daily with a meal. 180 tablet 3  . ONETOUCH DELICA LANCETS FINE MISC Inject 1 each into the skin 2 (two) times daily as needed. 100 each 5  . OZEMPIC, 1 MG/DOSE, 4 MG/3ML SOPN INJECT SUBCUTANEOUSLY 1MG  ONCE A WEEK 9 mL 3  . Semaglutide, 1 MG/DOSE, 2 MG/1.5ML SOPN Inject 1 mg into the skin once a week. 12 mL 1  . hydrochlorothiazide (HYDRODIURIL)  25 MG tablet Take 0.5 tablets (12.5 mg total) by mouth daily as needed (for ankle edema and/or BP>140/80). (Patient not taking: Reported on 09/14/2020) 30 tablet 1   No facility-administered medications prior to visit.    Allergies  Allergen Reactions  . Penicillins     ROS Review of Systems  Constitutional: Negative.   HENT: Negative.   Eyes: Negative for photophobia and visual disturbance.  Respiratory: Negative.   Cardiovascular: Negative.   Gastrointestinal: Negative.   Endocrine: Negative for polyphagia and polyuria.  Genitourinary: Negative.   Musculoskeletal: Negative for gait problem and myalgias.  Skin: Negative.   Neurological: Negative.   Hematological: Does not bruise/bleed easily.  Psychiatric/Behavioral: Negative.       Objective:    Physical Exam Vitals and nursing note reviewed.   Constitutional:      General: He is not in acute distress.    Appearance: Normal appearance. He is not ill-appearing, toxic-appearing or diaphoretic.  HENT:     Head: Normocephalic and atraumatic.     Right Ear: External ear normal.     Left Ear: External ear normal.  Neck:     Vascular: No carotid bruit.  Cardiovascular:     Rate and Rhythm: Normal rate and regular rhythm.     Pulses:          Dorsalis pedis pulses are 2+ on the right side and 2+ on the left side.       Posterior tibial pulses are 2+ on the right side and 2+ on the left side.  Pulmonary:     Effort: Pulmonary effort is normal.     Breath sounds: Normal breath sounds.  Musculoskeletal:     Cervical back: No rigidity or tenderness.     Right lower leg: No edema.     Left lower leg: No edema.  Lymphadenopathy:     Cervical: No cervical adenopathy.  Skin:    General: Skin is warm and dry.  Neurological:     Mental Status: He is alert and oriented to person, place, and time.  Psychiatric:        Mood and Affect: Mood normal.        Behavior: Behavior normal.    Diabetic Foot Exam - Simple   Simple Foot Form Diabetic Foot exam was performed with the following findings: Yes 09/14/2020  9:06 AM  Visual Inspection No deformities, no ulcerations, no other skin breakdown bilaterally: Yes Sensation Testing Intact to touch and monofilament testing bilaterally: Yes Pulse Check Posterior Tibialis and Dorsalis pulse intact bilaterally: Yes Comments Feet are cavus bilat.      BP 118/78 (BP Location: Left Arm, Patient Position: Sitting, Cuff Size: Normal)   Pulse 68   Temp 97.9 F (36.6 C) (Temporal)   Ht _0  (1.803 m)   Wt 198 lb 9.6 oz (90.1 kg)   SpO2 98%   BMI 27.70 kg/m  Wt Readings from Last 3 Encounters:  09/14/20 198 lb 9.6 oz (90.1 kg)  07/27/20 202 lb (91.6 kg)  07/13/20 201 lb (91.2 kg)     Health Maintenance Due  Topic Date Due  . Hepatitis C Screening  Never done  . COLONOSCOPY (Pts  45-13yr Insurance coverage will need to be confirmed)  Never done  . COVID-19 Vaccine (3 - Booster for Pfizer series) 08/06/2020  . FOOT EXAM  09/04/2020    There are no preventive care reminders to display for this patient.  Lab Results  Component Value Date   TSH  1.27 01/12/2018   Lab Results  Component Value Date   WBC 6.9 12/12/2019   HGB 16.3 12/12/2019   HCT 46.5 12/12/2019   MCV 86.9 12/12/2019   PLT 161.0 12/12/2019   Lab Results  Component Value Date   NA 137 07/13/2020   K 4.0 07/13/2020   CO2 25 07/13/2020   GLUCOSE 136 (H) 07/13/2020   BUN 29 (H) 07/13/2020   CREATININE 1.16 07/13/2020   BILITOT 0.8 12/12/2019   ALKPHOS 65 12/12/2019   AST 27 12/12/2019   ALT 35 12/12/2019   PROT 7.4 12/12/2019   ALBUMIN 4.6 12/12/2019   CALCIUM 9.9 07/13/2020   GFR 59.38 (L) 03/23/2020   Lab Results  Component Value Date   CHOL 152 12/12/2019   Lab Results  Component Value Date   HDL 29.00 (L) 12/12/2019   Lab Results  Component Value Date   LDLCALC 138 (H) 01/19/2019   Lab Results  Component Value Date   TRIG 234.0 (H) 12/12/2019   Lab Results  Component Value Date   CHOLHDL 5 12/12/2019   Lab Results  Component Value Date   HGBA1C 7.5 (H) 07/13/2020      Assessment & Plan:   Problem List Items Addressed This Visit      Endocrine   Type 2 diabetes mellitus with hyperglycemia, without long-term current use of insulin (HCC) - Primary     Other   Elevated LDL cholesterol level   Relevant Orders   Comprehensive metabolic panel   Lipid panel      No orders of the defined types were placed in this encounter.   Follow-up: Return in about 4 months (around 01/14/2021).   Patient desires to continue close follow-up.  Continue all medicines for his diabetes and cholesterol. Libby Maw, MD

## 2020-12-20 ENCOUNTER — Other Ambulatory Visit: Payer: Self-pay | Admitting: Family Medicine

## 2020-12-20 DIAGNOSIS — E1165 Type 2 diabetes mellitus with hyperglycemia: Secondary | ICD-10-CM

## 2021-04-05 ENCOUNTER — Ambulatory Visit: Payer: 59 | Admitting: Family Medicine

## 2021-04-05 ENCOUNTER — Other Ambulatory Visit (INDEPENDENT_AMBULATORY_CARE_PROVIDER_SITE_OTHER): Payer: 59

## 2021-04-05 ENCOUNTER — Other Ambulatory Visit: Payer: Self-pay

## 2021-04-05 DIAGNOSIS — E7800 Pure hypercholesterolemia, unspecified: Secondary | ICD-10-CM | POA: Diagnosis not present

## 2021-04-05 DIAGNOSIS — E1165 Type 2 diabetes mellitus with hyperglycemia: Secondary | ICD-10-CM

## 2021-04-05 LAB — URINALYSIS, ROUTINE W REFLEX MICROSCOPIC
Bilirubin Urine: NEGATIVE
Hgb urine dipstick: NEGATIVE
Ketones, ur: NEGATIVE
Leukocytes,Ua: NEGATIVE
Nitrite: NEGATIVE
RBC / HPF: NONE SEEN (ref 0–?)
Specific Gravity, Urine: 1.015 (ref 1.000–1.030)
Total Protein, Urine: NEGATIVE
Urine Glucose: >=1000 — AB
Urobilinogen, UA: 0.2 (ref 0.0–1.0)
pH: 5.5 (ref 5.0–8.0)

## 2021-04-05 LAB — COMPREHENSIVE METABOLIC PANEL
ALT: 27 U/L (ref 0–53)
AST: 24 U/L (ref 0–37)
Albumin: 4.5 g/dL (ref 3.5–5.2)
Alkaline Phosphatase: 91 U/L (ref 39–117)
BUN: 19 mg/dL (ref 6–23)
CO2: 28 mEq/L (ref 19–32)
Calcium: 9.2 mg/dL (ref 8.4–10.5)
Chloride: 98 mEq/L (ref 96–112)
Creatinine, Ser: 0.91 mg/dL (ref 0.40–1.50)
GFR: 97.61 mL/min (ref >60.00)
Glucose, Bld: 202 mg/dL — ABNORMAL HIGH (ref 70–99)
Potassium: 3.9 mEq/L (ref 3.5–5.1)
Sodium: 137 mEq/L (ref 135–145)
Total Bilirubin: 1.1 mg/dL (ref 0.2–1.2)
Total Protein: 7.7 g/dL (ref 6.0–8.3)

## 2021-04-05 LAB — LIPID PANEL
Cholesterol: 94 mg/dL (ref 0–200)
HDL: 26.6 mg/dL — ABNORMAL LOW (ref >39.00)
LDL Cholesterol: 30 mg/dL (ref 0–99)
NonHDL: 67.14
Total CHOL/HDL Ratio: 4
Triglycerides: 188 mg/dL — ABNORMAL HIGH (ref 0.0–149.0)
VLDL: 37.6 mg/dL (ref 0.0–40.0)

## 2021-04-05 LAB — CBC
HCT: 49 % (ref 39.0–52.0)
Hemoglobin: 17 g/dL (ref 13.0–17.0)
MCHC: 34.7 g/dL (ref 30.0–36.0)
MCV: 84.8 fl (ref 78.0–100.0)
Platelets: 168 10*3/uL (ref 150.0–400.0)
RBC: 5.77 Mil/uL (ref 4.22–5.81)
RDW: 12.7 % (ref 11.5–15.5)
WBC: 5.5 10*3/uL (ref 4.0–10.5)

## 2021-04-05 LAB — HEMOGLOBIN A1C: Hgb A1c MFr Bld: 7.5 % — ABNORMAL HIGH (ref 4.6–6.5)

## 2021-04-05 NOTE — Progress Notes (Signed)
Per the orders of Dr. Doreene Burke pt is here for labs, pt tolerated drawwell. Pt was able to provide adequate amount for urine sample.

## 2021-04-05 NOTE — Addendum Note (Signed)
Addended by: Andrez Grime on: 04/05/2021 10:25 AM   Modules accepted: Orders

## 2021-04-07 ENCOUNTER — Other Ambulatory Visit: Payer: Self-pay | Admitting: Family Medicine

## 2021-04-07 DIAGNOSIS — E1165 Type 2 diabetes mellitus with hyperglycemia: Secondary | ICD-10-CM

## 2021-04-11 ENCOUNTER — Ambulatory Visit (INDEPENDENT_AMBULATORY_CARE_PROVIDER_SITE_OTHER): Payer: 59

## 2021-04-11 ENCOUNTER — Encounter: Payer: Self-pay | Admitting: Emergency Medicine

## 2021-04-11 ENCOUNTER — Other Ambulatory Visit: Payer: Self-pay

## 2021-04-11 ENCOUNTER — Ambulatory Visit
Admission: EM | Admit: 2021-04-11 | Discharge: 2021-04-11 | Disposition: A | Discharge status: Home / Self Care | Payer: 59 | Attending: Urgent Care | Admitting: Urgent Care

## 2021-04-11 DIAGNOSIS — J018 Other acute sinusitis: Secondary | ICD-10-CM

## 2021-04-11 DIAGNOSIS — M542 Cervicalgia: Secondary | ICD-10-CM | POA: Diagnosis not present

## 2021-04-11 DIAGNOSIS — M25512 Pain in left shoulder: Secondary | ICD-10-CM | POA: Diagnosis not present

## 2021-04-11 DIAGNOSIS — E119 Type 2 diabetes mellitus without complications: Secondary | ICD-10-CM

## 2021-04-11 DIAGNOSIS — R051 Acute cough: Secondary | ICD-10-CM

## 2021-04-11 DIAGNOSIS — M62838 Other muscle spasm: Secondary | ICD-10-CM

## 2021-04-11 MED ORDER — TIZANIDINE HCL 4 MG PO TABS: 4.0000 mg | ORAL_TABLET | Freq: Every day | ORAL | 0 refills | Status: DC

## 2021-04-11 MED ORDER — DOXYCYCLINE HYCLATE 100 MG PO CAPS: 100.0000 mg | ORAL_CAPSULE | Freq: Two times a day (BID) | ORAL | 0 refills | Status: DC

## 2021-04-11 MED ORDER — PREDNISONE 20 MG PO TABS: ORAL_TABLET | ORAL | 0 refills | Status: DC

## 2021-04-11 NOTE — ED Provider Notes (Signed)
[facility]   MRN: [id number] DOB: Jun 07, 1971  Subjective:   Eric Sellers is a 50 y.o. male presenting for 2-week history of persistent malaise, sinus congestion, coughing, postnasal drainage.  Patient was having fevers last week as well.  He is also had some left-sided neck pain that goes from the base of his neck to his left shoulder.  States that it is interrupting his sleep and he is having a difficult time with this pain.  He has been using aspirin regularly.  Has a history of type 2 diabetes without insulin.  Last A1c was less than 8%, drawn this past week.  No falls, trauma.  No current facility-administered medications for this encounter.  Current Outpatient Medications:    atorvastatin (LIPITOR) 20 MG tablet, Take 1 tablet (20 mg total) by mouth daily., Disp: 90 tablet, Rfl: 3   Blood Glucose Monitoring Suppl (CONTOUR BLOOD GLUCOSE SYSTEM) w/Device KIT, Use to check glucose TID., Disp: 1 each, Rfl: 0   CONTOUR NEXT TEST test strip, USE TO CHECK GLUCOSE 3 TIMES A DAY, Disp: 100 strip, Rfl: 12   FARXIGA 10 MG TABS tablet, TAKE 1 TABLET BY MOUTH  DAILY BEFORE BREAKFAST, Disp: 90 tablet, Rfl: 3   glucose blood (ONE TOUCH ULTRA TEST) test strip, 1 each by Other route 2 (two) times daily as needed for other., Disp: 100 each, Rfl: 5   hydrochlorothiazide (HYDRODIURIL) 25 MG tablet, Take 0.5 tablets (12.5 mg total) by mouth daily as needed (for ankle edema and/or BP>140/80)., Disp: 30 tablet, Rfl: 1   metFORMIN (GLUCOPHAGE) 1000 MG tablet, TAKE 1 TABLET BY MOUTH  TWICE DAILY WITH MEALS, Disp: 180 tablet, Rfl: 3   ONETOUCH DELICA LANCETS FINE MISC, Inject 1 each into the skin 2 (two) times daily as needed., Disp: 100 each, Rfl: 5   OZEMPIC, 1 MG/DOSE, 4 MG/3ML SOPN, INJECT SUBCUTANEOUSLY 1MG   ONCE A WEEK, Disp: 9 mL, Rfl: 3   Semaglutide, 1 MG/DOSE, 2 MG/1.5ML SOPN, Inject 1 mg into the skin once a week., Disp: 12 mL, Rfl: 1   Allergies  Allergen Reactions   Penicillins      Past Medical History:  Diagnosis Date   Diabetes mellitus without complication (HCC)    History of kidney stones    Hypertension      Past Surgical History:  Procedure Laterality Date   TONSILLECTOMY      Family History  Problem Relation Age of Onset   Diabetes Mother    Hypertension Mother    Diabetes Father    Hypertension Father    Prostate cancer Father     Social History   Tobacco Use   Smoking status: Never   Smokeless tobacco: Never  Substance Use Topics   Alcohol use: Yes    Alcohol/week: 5.0 standard drinks    Types: 5 Cans of beer per week    Comment: occasional   Drug use: No    ROS   Objective:   Vitals: BP (!) 158/89 (BP Location: Left Arm)   Pulse 78   Temp 98 F (36.7 C) (Oral)   Ht 5\' 11"  (1.803 m)   Wt 197 lb (89.4 kg)   SpO2 95%   BMI 27.48 kg/m   Physical Exam Constitutional:      General: He is not in acute distress.    Appearance: Normal appearance. He is well-developed and normal weight. He is not ill-appearing, toxic-appearing or diaphoretic.  HENT:     Head: Normocephalic and atraumatic.  Right Ear: Tympanic membrane, ear canal and external ear normal. There is no impacted cerumen.     Left Ear: Tympanic membrane, ear canal and external ear normal. There is no impacted cerumen.     Nose: Nose normal. No congestion or rhinorrhea.     Mouth/Throat:     Mouth: Mucous membranes are moist.     Pharynx: No oropharyngeal exudate or posterior oropharyngeal erythema.  Eyes:     General: No scleral icterus.       Right eye: No discharge.        Left eye: No discharge.     Extraocular Movements: Extraocular movements intact.     Conjunctiva/sclera: Conjunctivae normal.     Pupils: Pupils are equal, round, and reactive to light.  Cardiovascular:     Rate and Rhythm: Normal rate and regular rhythm.     Heart sounds: Normal heart sounds. No murmur heard.   No friction rub. No gallop.  Pulmonary:     Effort: Pulmonary effort  is normal. No respiratory distress.     Breath sounds: Normal breath sounds. No stridor. No wheezing, rhonchi or rales.  Musculoskeletal:     Cervical back: Normal range of motion and neck supple. No rigidity. No muscular tenderness.     Comments: Full range of motion throughout.  Strength 5/5 for upper and lower extremities.  Patient ambulates without any assistance at expected pace.  No ecchymosis, swelling, lacerations or abrasions.  No tenderness along the cervical paraspinal muscles.  However he has tenderness along the left trapezius.  No tenderness of the shoulder joint.  No ecchymosis, erythema, bony deformity, warmth.  Skin:    General: Skin is warm and dry.  Neurological:     General: No focal deficit present.     Mental Status: He is alert and oriented to person, place, and time.     Motor: No weakness.     Coordination: Coordination normal.     Gait: Gait normal.     Deep Tendon Reflexes: Reflexes normal.  Psychiatric:        Mood and Affect: Mood normal.        Behavior: Behavior normal.        Thought Content: Thought content normal.        Judgment: Judgment normal.   Negative Spurling maneuver and Lhermitte sign.  DG Cervical Spine Complete  Result Date: 04/11/2021 CLINICAL DATA:  Neck pain EXAM: CERVICAL SPINE - COMPLETE 4+ VIEW COMPARISON:  None. FINDINGS: No evidence of cervical spine fracture or prevertebral soft tissue swelling. Normal alignment. Vertebral body heights and disc spaces are well maintained. No significant facet arthropathy. IMPRESSION: No acute osseous abnormality or significant degenerative changes. Electronically Signed   By: Yetta Glassman M.D.   On: 04/11/2021 16:13   DG Shoulder Left  Result Date: 04/11/2021 CLINICAL DATA:  Left shoulder pain EXAM: LEFT SHOULDER - 2+ VIEW COMPARISON:  None. FINDINGS: There is no evidence of fracture or dislocation. There is no evidence of arthropathy or other focal bone abnormality. Soft tissues are unremarkable.  IMPRESSION: Negative. Electronically Signed   By: Kerby Moors M.D.   On: 04/11/2021 16:11    Recent Results (from the past 2160 hour(s))  CBC     Status: None   Collection Time: 04/05/21 11:27 AM  Result Value Ref Range   WBC 5.5 4.0 - 10.5 K/uL   RBC 5.77 4.22 - 5.81 Mil/uL   Platelets 168.0 150.0 - 400.0 K/uL   Hemoglobin 17.0 13.0 -  17.0 g/dL   HCT 49.0 39.0 - 52.0 %   MCV 84.8 78.0 - 100.0 fl   MCHC 34.7 30.0 - 36.0 g/dL   RDW 12.7 11.5 - 15.5 %  Comprehensive metabolic panel     Status: Abnormal   Collection Time: 04/05/21 11:27 AM  Result Value Ref Range   Sodium 137 135 - 145 mEq/L   Potassium 3.9 3.5 - 5.1 mEq/L   Chloride 98 96 - 112 mEq/L   CO2 28 19 - 32 mEq/L   Glucose, Bld 202 (H) 70 - 99 mg/dL   BUN 19 6 - 23 mg/dL   Creatinine, Ser 0.91 0.40 - 1.50 mg/dL   Total Bilirubin 1.1 0.2 - 1.2 mg/dL   Alkaline Phosphatase 91 39 - 117 U/L   AST 24 0 - 37 U/L   ALT 27 0 - 53 U/L   Total Protein 7.7 6.0 - 8.3 g/dL   Albumin 4.5 3.5 - 5.2 g/dL   GFR 97.61 >60.00 mL/min    Comment: Calculated using the CKD-EPI Creatinine Equation (2021)   Calcium 9.2 8.4 - 10.5 mg/dL  Lipid panel     Status: Abnormal   Collection Time: 04/05/21 11:27 AM  Result Value Ref Range   Cholesterol 94 0 - 200 mg/dL    Comment: ATP III Classification       Desirable:  < 200 mg/dL               Borderline High:  200 - 239 mg/dL          High:  > = 240 mg/dL   Triglycerides 188.0 (H) 0.0 - 149.0 mg/dL    Comment: Normal:  <150 mg/dLBorderline High:  150 - 199 mg/dL   HDL 26.60 (L) >39.00 mg/dL   VLDL 37.6 0.0 - 40.0 mg/dL   LDL Cholesterol 30 0 - 99 mg/dL   Total CHOL/HDL Ratio 4     Comment:                Men          Women1/2 Average Risk     3.4          3.3Average Risk          5.0          4.42X Average Risk          9.6          7.13X Average Risk          15.0          11.0                       NonHDL 67.14     Comment: NOTE:  Non-HDL goal should be 30 mg/dL higher than patient's LDL  goal (i.e. LDL goal of < 70 mg/dL, would have non-HDL goal of < 100 mg/dL)  HgB A1c     Status: Abnormal   Collection Time: 04/05/21 11:27 AM  Result Value Ref Range   Hgb A1c MFr Bld 7.5 (H) 4.6 - 6.5 %    Comment: Glycemic Control Guidelines for People with Diabetes:Non Diabetic:  <6%Goal of Therapy: <7%Additional Action Suggested:  >8%   Urinalysis, Routine w reflex microscopic     Status: Abnormal   Collection Time: 04/05/21 11:27 AM  Result Value Ref Range   Color, Urine YELLOW Yellow;Lt. Yellow;Straw;Dark Yellow;Amber;Green;Red;Brown   APPearance CLEAR Clear;Turbid;Slightly Cloudy;Cloudy   Specific Gravity, Urine 1.015 1.000 - 1.030  pH 5.5 5.0 - 8.0   Total Protein, Urine NEGATIVE Negative   Urine Glucose >=1000 (A) Negative   Ketones, ur NEGATIVE Negative   Bilirubin Urine NEGATIVE Negative   Hgb urine dipstick NEGATIVE Negative   Urobilinogen, UA 0.2 0.0 - 1.0   Leukocytes,Ua NEGATIVE Negative   Nitrite NEGATIVE Negative   WBC, UA 0-2/hpf 0-2/hpf   RBC / HPF none seen 0-2/hpf      Assessment and Plan :   PDMP not reviewed this encounter.  1. Acute non-recurrent sinusitis of other sinus   2. Acute cough   3. Trapezius muscle spasm   4. Acute pain of left shoulder   5. Neck pain on left side   6. Type 2 diabetes mellitus treated without insulin ([place])     Will start empiric treatment for sinusitis with doxycycline.  Recommended supportive care otherwise including the use of oral antihistamine.  Given the severity of his pain and persistent coughing, sinus congestion will use an oral prednisone course.  Follow-up with Ortho if the neck and shoulder pain persist.  Counseled patient on potential for adverse effects with medications prescribed/recommended today, ER and return-to-clinic precautions discussed, patient verbalized understanding.    Jaynee Eagles, PA-C 04/11/21 1625

## 2021-04-11 NOTE — ED Triage Notes (Signed)
Patient c/o non-productive cough and left shoulder pain.  Patient was febrile last week.  Patient taken Mucinex DM, Nyquil, Delsym.  Taken Ibuprofen, ASA.  Patient is vaccinated for COVID.

## 2021-04-22 ENCOUNTER — Other Ambulatory Visit: Payer: Self-pay

## 2021-04-22 ENCOUNTER — Encounter: Payer: Self-pay | Admitting: Family Medicine

## 2021-04-22 ENCOUNTER — Ambulatory Visit (INDEPENDENT_AMBULATORY_CARE_PROVIDER_SITE_OTHER): Payer: 59 | Admitting: Family Medicine

## 2021-04-22 VITALS — BP 136/80 | HR 102 | Temp 97.4°F | Ht 71.0 in | Wt 198.8 lb

## 2021-04-22 DIAGNOSIS — E7800 Pure hypercholesterolemia, unspecified: Secondary | ICD-10-CM

## 2021-04-22 DIAGNOSIS — E1165 Type 2 diabetes mellitus with hyperglycemia: Secondary | ICD-10-CM | POA: Diagnosis not present

## 2021-04-22 DIAGNOSIS — E119 Type 2 diabetes mellitus without complications: Secondary | ICD-10-CM

## 2021-04-22 MED ORDER — ATORVASTATIN CALCIUM 20 MG PO TABS: 20.0000 mg | ORAL_TABLET | Freq: Every day | ORAL | 3 refills | Status: DC

## 2021-04-22 MED ORDER — METFORMIN HCL 1000 MG PO TABS: 1000.0000 mg | ORAL_TABLET | Freq: Two times a day (BID) | ORAL | 3 refills | Status: DC

## 2021-04-22 MED ORDER — DAPAGLIFLOZIN PROPANEDIOL 10 MG PO TABS: 10.0000 mg | ORAL_TABLET | Freq: Every day | ORAL | 3 refills | Status: DC

## 2021-04-22 MED ORDER — LISINOPRIL 10 MG PO TABS: 10.0000 mg | ORAL_TABLET | Freq: Every day | ORAL | 3 refills | Status: DC

## 2021-04-22 MED ORDER — SEMAGLUTIDE (1 MG/DOSE) 2 MG/1.5ML ~~LOC~~ SOPN: 1.0000 mg | PEN_INJECTOR | SUBCUTANEOUS | 1 refills | Status: DC

## 2021-04-22 NOTE — Progress Notes (Addendum)
Established Patient Office Visit  Subjective:  Patient ID: Eric Sellers, male    DOB: 1971/06/17  Age: 50 y.o. MRN: [id number]  CC:  Chief Complaint  Patient presents with   Follow-up    Follow up on DM, would like right knee checked tingle feeling in joint sometimes.     HPI Eric Sellers presents for follow-up of diabetes, elevated cholesterol and elevated blood pressure.  Hemoglobin A1c is increased to 7.5 since last check.  Blood pressure is up as well cholesterol remains under control.  Admits some dietary discretion.  Small weight gain.  DOT physical due in April.  Past Medical History:  Diagnosis Date   Diabetes mellitus without complication ([place])    History of kidney stones    Hypertension     Past Surgical History:  Procedure Laterality Date   TONSILLECTOMY      Family History  Problem Relation Age of Onset   Diabetes Mother    Hypertension Mother    Diabetes Father    Hypertension Father    Prostate cancer Father     Social History   Socioeconomic History   Marital status: Single    Spouse name: Not on file   Number of children: Not on file   Years of education: Not on file   Highest education level: Not on file  Occupational History   Not on file  Tobacco Use   Smoking status: Never   Smokeless tobacco: Never  Substance and Sexual Activity   Alcohol use: Yes    Alcohol/week: 5.0 standard drinks    Types: 5 Cans of beer per week    Comment: occasional   Drug use: No   Sexual activity: Yes  Other Topics Concern   Not on file  Social History Narrative   Not on file   Social Determinants of Health   Financial Resource Strain: Not on file  Food Insecurity: Not on file  Transportation Needs: Not on file  Physical Activity: Not on file  Stress: Not on file  Social Connections: Not on file  Intimate Partner Violence: Not on file    Outpatient Medications Prior to Visit  Medication Sig Dispense Refill   Blood Glucose Monitoring Suppl  (CONTOUR BLOOD GLUCOSE SYSTEM) w/Device KIT Use to check glucose TID. 1 each 0   CONTOUR NEXT TEST test strip USE TO CHECK GLUCOSE 3 TIMES A DAY 100 strip 12   glucose blood (ONE TOUCH ULTRA TEST) test strip 1 each by Other route 2 (two) times daily as needed for other. 100 each 5   ONETOUCH DELICA LANCETS FINE MISC Inject 1 each into the skin 2 (two) times daily as needed. 100 each 5   atorvastatin (LIPITOR) 20 MG tablet Take 1 tablet (20 mg total) by mouth daily. 90 tablet 3   FARXIGA 10 MG TABS tablet TAKE 1 TABLET BY MOUTH  DAILY BEFORE BREAKFAST 90 tablet 3   metFORMIN (GLUCOPHAGE) 1000 MG tablet TAKE 1 TABLET BY MOUTH  TWICE DAILY WITH MEALS 180 tablet 3   OZEMPIC, 1 MG/DOSE, 4 MG/3ML SOPN INJECT SUBCUTANEOUSLY 1MG  ONCE A WEEK 9 mL 3   Semaglutide, 1 MG/DOSE, 2 MG/1.5ML SOPN Inject 1 mg into the skin once a week. 12 mL 1   tiZANidine (ZANAFLEX) 4 MG tablet Take 1 tablet (4 mg total) by mouth at bedtime. (Patient not taking: Reported on 04/22/2021) 30 tablet 0   doxycycline (VIBRAMYCIN) 100 MG capsule Take 1 capsule (100 mg total) by mouth 2 (  two) times daily. (Patient not taking: Reported on 04/22/2021) 14 capsule 0   hydrochlorothiazide (HYDRODIURIL) 25 MG tablet Take 0.5 tablets (12.5 mg total) by mouth daily as needed (for ankle edema and/or BP>140/80). (Patient not taking: Reported on 04/22/2021) 30 tablet 1   predniSONE (DELTASONE) 20 MG tablet Take 2 tablets daily with breakfast. 10 tablet 0   No facility-administered medications prior to visit.    Allergies  Allergen Reactions   Penicillins     ROS Review of Systems  Constitutional: Negative.   HENT: Negative.    Eyes:  Negative for photophobia and visual disturbance.  Respiratory: Negative.    Cardiovascular: Negative.   Gastrointestinal: Negative.   Endocrine: Negative for polyphagia and polyuria.  Genitourinary: Negative.   Musculoskeletal:  Positive for arthralgias. Negative for gait problem.  Neurological:   Negative for speech difficulty and weakness.  Hematological:  Does not bruise/bleed easily.  Psychiatric/Behavioral: Negative.       Objective:    Physical Exam Vitals and nursing note reviewed.  Constitutional:      General: He is not in acute distress.    Appearance: Normal appearance. He is not ill-appearing, toxic-appearing or diaphoretic.  HENT:     Head: Normocephalic and atraumatic.     Right Ear: Tympanic membrane, ear canal and external ear normal.     Left Ear: Tympanic membrane, ear canal and external ear normal.     Mouth/Throat:     Mouth: Mucous membranes are dry.     Pharynx: Oropharynx is clear. No oropharyngeal exudate or posterior oropharyngeal erythema.  Eyes:     General:        Right eye: No discharge.        Left eye: No discharge.     Extraocular Movements: Extraocular movements intact.     Conjunctiva/sclera: Conjunctivae normal.     Pupils: Pupils are equal, round, and reactive to light.  Cardiovascular:     Pulses: Normal pulses.     Heart sounds: Normal heart sounds.  Pulmonary:     Effort: Pulmonary effort is normal.     Breath sounds: Normal breath sounds.  Abdominal:     General: Bowel sounds are normal.  Musculoskeletal:     Cervical back: No rigidity or tenderness.     Right knee: No swelling, effusion, erythema or bony tenderness. Normal range of motion. No tenderness.     Instability Tests: Anterior drawer test negative. Posterior drawer test negative.  Lymphadenopathy:     Cervical: No cervical adenopathy.  Skin:    General: Skin is warm and dry.  Neurological:     Mental Status: He is alert.  Psychiatric:        Behavior: Behavior normal.    BP 136/80 (BP Location: Right Arm, Patient Position: Sitting, Cuff Size: Normal)   Pulse (!) 102   Temp (!) 97.4 F (36.3 C) (Temporal)   Ht 5' 11" (1.803 m)   Wt 198 lb 12.8 oz (90.2 kg)   SpO2 98%   BMI 27.73 kg/m  Wt Readings from Last 3 Encounters:  04/22/21 198 lb 12.8 oz (90.2 kg)   04/11/21 197 lb (89.4 kg)  09/14/20 198 lb 9.6 oz (90.1 kg)     Health Maintenance Due  Topic Date Due   Hepatitis C Screening  Never done   COLONOSCOPY (Pts 45-38yr Insurance coverage will need to be confirmed)  Never done   Zoster Vaccines- Shingrix (1 of 2) Never done   URINE MICROALBUMIN  12/11/2020  OPHTHALMOLOGY EXAM  02/20/2021    There are no preventive care reminders to display for this patient.  Lab Results  Component Value Date   TSH 1.27 01/12/2018   Lab Results  Component Value Date   WBC 5.5 04/05/2021   HGB 17.0 04/05/2021   HCT 49.0 04/05/2021   MCV 84.8 04/05/2021   PLT 168.0 04/05/2021   Lab Results  Component Value Date   NA 137 04/05/2021   K 3.9 04/05/2021   CO2 28 04/05/2021   GLUCOSE 202 (H) 04/05/2021   BUN 19 04/05/2021   CREATININE 0.91 04/05/2021   BILITOT 1.1 04/05/2021   ALKPHOS 91 04/05/2021   AST 24 04/05/2021   ALT 27 04/05/2021   PROT 7.7 04/05/2021   ALBUMIN 4.5 04/05/2021   CALCIUM 9.2 04/05/2021   GFR 97.61 04/05/2021   Lab Results  Component Value Date   CHOL 94 04/05/2021   Lab Results  Component Value Date   HDL 26.60 (L) 04/05/2021   Lab Results  Component Value Date   LDLCALC 30 04/05/2021   Lab Results  Component Value Date   TRIG 188.0 (H) 04/05/2021   Lab Results  Component Value Date   CHOLHDL 4 04/05/2021   Lab Results  Component Value Date   HGBA1C 7.5 (H) 04/05/2021      Assessment & Plan:   Problem List Items Addressed This Visit       Other   Elevated LDL cholesterol level   Relevant Medications   atorvastatin (LIPITOR) 20 MG tablet   Other Visit Diagnoses     Type 2 diabetes mellitus without complication, without long-term current use of insulin ([facility])    -  Primary   Relevant Medications   atorvastatin (LIPITOR) 20 MG tablet   dapagliflozin propanediol (FARXIGA) 10 MG TABS tablet   metFORMIN (GLUCOPHAGE) 1000 MG tablet   Semaglutide, 1 MG/DOSE, 2 MG/1.5ML SOPN   lisinopril  (ZESTRIL) 10 MG tablet   Uncontrolled type 2 diabetes mellitus with hyperglycemia ([facility])       Relevant Medications   atorvastatin (LIPITOR) 20 MG tablet   dapagliflozin propanediol (FARXIGA) 10 MG TABS tablet   metFORMIN (GLUCOPHAGE) 1000 MG tablet   Semaglutide, 1 MG/DOSE, 2 MG/1.5ML SOPN   lisinopril (ZESTRIL) 10 MG tablet       Meds ordered this encounter  Medications   atorvastatin (LIPITOR) 20 MG tablet    Sig: Take 1 tablet (20 mg total) by mouth daily.    Dispense:  90 tablet    Refill:  3   dapagliflozin propanediol (FARXIGA) 10 MG TABS tablet    Sig: Take 1 tablet (10 mg total) by mouth daily before breakfast.    Dispense:  90 tablet    Refill:  3    Requesting 1 year supply   metFORMIN (GLUCOPHAGE) 1000 MG tablet    Sig: Take 1 tablet (1,000 mg total) by mouth 2 (two) times daily with a meal.    Dispense:  180 tablet    Refill:  3    Requesting 1 year supply   Semaglutide, 1 MG/DOSE, 2 MG/1.5ML SOPN    Sig: Inject 1 mg into the skin once a week.    Dispense:  12 mL    Refill:  1   lisinopril (ZESTRIL) 10 MG tablet    Sig: Take 1 tablet (10 mg total) by mouth daily.    Dispense:  90 tablet    Refill:  3    Follow-up: Return in about 4  months (around 08/23/2021).  Continue home medicines as above.  Have added low-dose lisinopril back for blood pressure control and renal protection.  Libby Maw, MD

## 2021-07-19 ENCOUNTER — Ambulatory Visit: Payer: 59 | Admitting: Nurse Practitioner

## 2021-07-19 ENCOUNTER — Encounter: Payer: Self-pay | Admitting: Nurse Practitioner

## 2021-07-19 VITALS — BP 128/76 | HR 108 | Temp 98.4°F | Resp 12 | Ht 71.0 in | Wt 199.1 lb

## 2021-07-19 DIAGNOSIS — J069 Acute upper respiratory infection, unspecified: Secondary | ICD-10-CM

## 2021-07-19 MED ORDER — FLUTICASONE PROPIONATE 50 MCG/ACT NA SUSP
2.0000 | Freq: Every day | NASAL | 0 refills | Status: DC
Start: 2021-07-19 — End: 2022-05-02

## 2021-07-19 MED ORDER — AZITHROMYCIN 250 MG PO TABS
ORAL_TABLET | ORAL | 0 refills | Status: AC
Start: 2021-07-19 — End: 2021-07-24

## 2021-07-19 NOTE — Assessment & Plan Note (Signed)
Given the symptoms we will treat for upper respiratory infection.  Discussed with patient.  Start azithromycin 200 mg as directed on pack.  Also start some Flonase 2 sprays each nostril daily.  Did give fluticasone precautions in regards to epistaxis and using nasal saline.  Follow-up if no improvement

## 2021-07-19 NOTE — Patient Instructions (Signed)
Nice to see you today Sent in some nose spray and antibiotic to your pharmacy The nose spray can cause nose bleeds with extended use. If that happens stop using the nose spray or get some nasal saline. Follow up if your symptoms worsen or fail to improve

## 2021-07-19 NOTE — Progress Notes (Signed)
Acute Office Visit  Subjective:    Patient ID: Eric Sellers, male    DOB: 1970/12/01, 51 y.o.   MRN: [id number]  Chief Complaint  Patient presents with   Cough    X 2 weeks, some pressure in the ears, coughing up some yellowish phlegm, post nasal drip. No fever. Covid test at home negative on 07/17/21    HPI Patient is in today for Cough  Symptoms started approx 2-2.5 weeks Covid test on 07/17/2021 Pfizer x 2 Mucinex Dm, delsyum, cold flu over the counter. States that the cough is worse in the morning.  Past Medical History:  Diagnosis Date   Diabetes mellitus without complication ([place])    History of kidney stones    Hypertension     Past Surgical History:  Procedure Laterality Date   TONSILLECTOMY      Family History  Problem Relation Age of Onset   Diabetes Mother    Hypertension Mother    Diabetes Father    Hypertension Father    Prostate cancer Father     Social History   Socioeconomic History   Marital status: Single    Spouse name: Not on file   Number of children: Not on file   Years of education: Not on file   Highest education level: Not on file  Occupational History   Not on file  Tobacco Use   Smoking status: Never   Smokeless tobacco: Never  Substance and Sexual Activity   Alcohol use: Yes    Alcohol/week: 5.0 standard drinks    Types: 5 Cans of beer per week    Comment: occasional   Drug use: No   Sexual activity: Yes  Other Topics Concern   Not on file  Social History Narrative   Not on file   Social Determinants of Health   Financial Resource Strain: Not on file  Food Insecurity: Not on file  Transportation Needs: Not on file  Physical Activity: Not on file  Stress: Not on file  Social Connections: Not on file  Intimate Partner Violence: Not on file    Outpatient Medications Prior to Visit  Medication Sig Dispense Refill   atorvastatin (LIPITOR) 20 MG tablet Take 1 tablet (20 mg total) by mouth daily. 90 tablet 3    Blood Glucose Monitoring Suppl (CONTOUR BLOOD GLUCOSE SYSTEM) w/Device KIT Use to check glucose TID. 1 each 0   CONTOUR NEXT TEST test strip USE TO CHECK GLUCOSE 3 TIMES A DAY 100 strip 12   dapagliflozin propanediol (FARXIGA) 10 MG TABS tablet Take 1 tablet (10 mg total) by mouth daily before breakfast. 90 tablet 3   glucose blood (ONE TOUCH ULTRA TEST) test strip 1 each by Other route 2 (two) times daily as needed for other. 100 each 5   lisinopril (ZESTRIL) 10 MG tablet Take 1 tablet (10 mg total) by mouth daily. 90 tablet 3   metFORMIN (GLUCOPHAGE) 1000 MG tablet Take 1 tablet (1,000 mg total) by mouth 2 (two) times daily with a meal. 180 tablet 3   ONETOUCH DELICA LANCETS FINE MISC Inject 1 each into the skin 2 (two) times daily as needed. 100 each 5   Semaglutide, 1 MG/DOSE, 2 MG/1.5ML SOPN Inject 1 mg into the skin once a week. 12 mL 1   tiZANidine (ZANAFLEX) 4 MG tablet Take 1 tablet (4 mg total) by mouth at bedtime. (Patient not taking: Reported on 04/22/2021) 30 tablet 0   No facility-administered medications prior to visit.  Allergies  Allergen Reactions   Penicillins     Review of Systems  Constitutional:  Negative for chills, fatigue and fever.  HENT:  Positive for congestion, ear pain (pressure), postnasal drip and sore throat. Negative for sinus pressure and sinus pain.   Respiratory:  Positive for cough (yellow/green). Negative for shortness of breath.   Cardiovascular:  Negative for chest pain.  Gastrointestinal:  Negative for abdominal pain, diarrhea, nausea and vomiting.  Musculoskeletal:  Negative for arthralgias and myalgias.  Neurological:  Negative for headaches.      Objective:    Physical Exam Vitals and nursing note reviewed.  Constitutional:      Appearance: Normal appearance.  HENT:     Right Ear: Tympanic membrane, ear canal and external ear normal. There is no impacted cerumen.     Left Ear: Tympanic membrane, ear canal and external ear normal.  There is no impacted cerumen.     Mouth/Throat:     Mouth: Mucous membranes are moist.     Comments: Cobblestoning  Eyes:     Comments: Wears glasses   Cardiovascular:     Rate and Rhythm: Regular rhythm. Tachycardia present.     Heart sounds: Normal heart sounds.  Pulmonary:     Effort: Pulmonary effort is normal.     Breath sounds: Normal breath sounds.  Abdominal:     General: Bowel sounds are normal.  Skin:    General: Skin is warm.  Neurological:     Mental Status: He is alert.    There were no vitals taken for this visit. Wt Readings from Last 3 Encounters:  04/22/21 198 lb 12.8 oz (90.2 kg)  04/11/21 197 lb (89.4 kg)  09/14/20 198 lb 9.6 oz (90.1 kg)    Health Maintenance Due  Topic Date Due   Hepatitis C Screening  Never done   COLONOSCOPY (Pts 45-64yrs Insurance coverage will need to be confirmed)  Never done   Pneumococcal Vaccine 47-88 Years old (2 - PCV) 05/20/2017   Zoster Vaccines- Shingrix (1 of 2) Never done   OPHTHALMOLOGY EXAM  02/20/2021    There are no preventive care reminders to display for this patient.   Lab Results  Component Value Date   TSH 1.27 01/12/2018   Lab Results  Component Value Date   WBC 5.5 04/05/2021   HGB 17.0 04/05/2021   HCT 49.0 04/05/2021   MCV 84.8 04/05/2021   PLT 168.0 04/05/2021   Lab Results  Component Value Date   NA 137 04/05/2021   K 3.9 04/05/2021   CO2 28 04/05/2021   GLUCOSE 202 (H) 04/05/2021   BUN 19 04/05/2021   CREATININE 0.91 04/05/2021   BILITOT 1.1 04/05/2021   ALKPHOS 91 04/05/2021   AST 24 04/05/2021   ALT 27 04/05/2021   PROT 7.7 04/05/2021   ALBUMIN 4.5 04/05/2021   CALCIUM 9.2 04/05/2021   GFR 97.61 04/05/2021   Lab Results  Component Value Date   CHOL 94 04/05/2021   Lab Results  Component Value Date   HDL 26.60 (L) 04/05/2021   Lab Results  Component Value Date   LDLCALC 30 04/05/2021   Lab Results  Component Value Date   TRIG 188.0 (H) 04/05/2021   Lab Results   Component Value Date   CHOLHDL 4 04/05/2021   Lab Results  Component Value Date   HGBA1C 7.5 (H) 04/05/2021       Assessment & Plan:   Problem List Items Addressed This Visit  Respiratory   Upper respiratory tract infection - Primary    Given the symptoms we will treat for upper respiratory infection.  Discussed with patient.  Start azithromycin 200 mg as directed on pack.  Also start some Flonase 2 sprays each nostril daily.  Did give fluticasone precautions in regards to epistaxis and using nasal saline.  Follow-up if no improvement      Relevant Medications   azithromycin (ZITHROMAX) 250 MG tablet   fluticasone (FLONASE) 50 MCG/ACT nasal spray     Meds ordered this encounter  Medications   azithromycin (ZITHROMAX) 250 MG tablet    Sig: Take 2 tablets on day 1, then 1 tablet daily on days 2 through 5    Dispense:  6 tablet    Refill:  0    Order Specific Question:   Supervising Provider    Answer:   Loura Pardon A [[id number]]   fluticasone (FLONASE) 50 MCG/ACT nasal spray    Sig: Place 2 sprays into both nostrils daily.    Dispense:  16 g    Refill:  0    Order Specific Question:   Supervising Provider    Answer:   Loura Pardon A [[id number]]   This visit occurred during the SARS-CoV-2 public health emergency.  Safety protocols were in place, including screening questions prior to the visit, additional usage of staff PPE, and extensive cleaning of exam room while observing appropriate contact time as indicated for disinfecting solutions.    Romilda Garret, NP

## 2021-08-14 ENCOUNTER — Other Ambulatory Visit: Payer: Self-pay | Admitting: Family Medicine

## 2021-08-14 DIAGNOSIS — E119 Type 2 diabetes mellitus without complications: Secondary | ICD-10-CM

## 2021-08-17 LAB — HM DIABETES EYE EXAM

## 2021-08-22 ENCOUNTER — Other Ambulatory Visit: Payer: Self-pay

## 2021-08-23 ENCOUNTER — Ambulatory Visit: Payer: 59 | Admitting: Family Medicine

## 2021-08-23 ENCOUNTER — Encounter: Payer: Self-pay | Admitting: Family Medicine

## 2021-08-23 VITALS — BP 140/80 | HR 88 | Temp 97.5°F | Ht 71.0 in | Wt 199.4 lb

## 2021-08-23 DIAGNOSIS — E7800 Pure hypercholesterolemia, unspecified: Secondary | ICD-10-CM

## 2021-08-23 DIAGNOSIS — E119 Type 2 diabetes mellitus without complications: Secondary | ICD-10-CM | POA: Diagnosis not present

## 2021-08-23 DIAGNOSIS — Z125 Encounter for screening for malignant neoplasm of prostate: Secondary | ICD-10-CM | POA: Diagnosis not present

## 2021-08-23 DIAGNOSIS — I10 Essential (primary) hypertension: Secondary | ICD-10-CM | POA: Diagnosis not present

## 2021-08-23 LAB — MICROALBUMIN / CREATININE URINE RATIO
Creatinine,U: 62.3 mg/dL
Microalb Creat Ratio: 1.1 mg/g (ref 0.0–30.0)
Microalb, Ur: <0.7 mg/dL (ref 0.0–1.9)

## 2021-08-23 LAB — BASIC METABOLIC PANEL
BUN: 18 mg/dL (ref 6–23)
CO2: 31 mEq/L (ref 19–32)
Calcium: 9.7 mg/dL (ref 8.4–10.5)
Chloride: 101 mEq/L (ref 96–112)
Creatinine, Ser: 0.86 mg/dL (ref 0.40–1.50)
GFR: 100.01 mL/min (ref >60.00)
Glucose, Bld: 165 mg/dL — ABNORMAL HIGH (ref 70–99)
Potassium: 4.5 mEq/L (ref 3.5–5.1)
Sodium: 138 mEq/L (ref 135–145)

## 2021-08-23 LAB — HEMOGLOBIN A1C: Hgb A1c MFr Bld: 7.6 % — ABNORMAL HIGH (ref 4.6–6.5)

## 2021-08-23 LAB — URINALYSIS, ROUTINE W REFLEX MICROSCOPIC
Bilirubin Urine: NEGATIVE
Hgb urine dipstick: NEGATIVE
Ketones, ur: NEGATIVE
Leukocytes,Ua: NEGATIVE
Nitrite: NEGATIVE
RBC / HPF: NONE SEEN (ref 0–?)
Specific Gravity, Urine: 1.01 (ref 1.000–1.030)
Total Protein, Urine: NEGATIVE
Urine Glucose: >=1000 — AB
Urobilinogen, UA: 0.2 (ref 0.0–1.0)
pH: 5.5 (ref 5.0–8.0)

## 2021-08-23 LAB — LIPID PANEL
Cholesterol: 114 mg/dL (ref 0–200)
HDL: 40.1 mg/dL (ref >39.00)
LDL Cholesterol: 57 mg/dL (ref 0–99)
NonHDL: 74.08
Total CHOL/HDL Ratio: 3
Triglycerides: 84 mg/dL (ref 0.0–149.0)
VLDL: 16.8 mg/dL (ref 0.0–40.0)

## 2021-08-23 MED ORDER — LISINOPRIL 20 MG PO TABS: 20.0000 mg | ORAL_TABLET | Freq: Every day | ORAL | 3 refills | Status: DC

## 2021-08-23 NOTE — Progress Notes (Signed)
Established Patient Office Visit  Subjective:  Patient ID: Eric Sellers, male    DOB: Feb 25, 1971  Age: 51 y.o. MRN: [id number]  CC:  Chief Complaint  Patient presents with   Follow-up    4 month follow up, no concerns. Patient fasting.     HPI Eric Sellers presents for follow-up of diabetes, hypertension and elevated cholesterol.  Doing well.  Tolerating his medications.  Mom died back in 04-23-2023.  Of course, he continues to grieve some.  Has not been exercising as much he would like.  Plans on returning soon.  DOT is due in April.  Past Medical History:  Diagnosis Date   Diabetes mellitus without complication ([place])    History of kidney stones    Hypertension     Past Surgical History:  Procedure Laterality Date   TONSILLECTOMY      Family History  Problem Relation Age of Onset   Diabetes Mother    Hypertension Mother    Diabetes Father    Hypertension Father    Prostate cancer Father     Social History   Socioeconomic History   Marital status: Single    Spouse name: Not on file   Number of children: Not on file   Years of education: Not on file   Highest education level: Not on file  Occupational History   Not on file  Tobacco Use   Smoking status: Never   Smokeless tobacco: Never  Substance and Sexual Activity   Alcohol use: Yes    Alcohol/week: 5.0 standard drinks    Types: 5 Cans of beer per week    Comment: occasional   Drug use: No   Sexual activity: Yes  Other Topics Concern   Not on file  Social History Narrative   Not on file   Social Determinants of Health   Financial Resource Strain: Not on file  Food Insecurity: Not on file  Transportation Needs: Not on file  Physical Activity: Not on file  Stress: Not on file  Social Connections: Not on file  Intimate Partner Violence: Not on file    Outpatient Medications Prior to Visit  Medication Sig Dispense Refill   atorvastatin (LIPITOR) 20 MG tablet Take 1 tablet (20 mg total) by  mouth daily. 90 tablet 3   Blood Glucose Monitoring Suppl (CONTOUR BLOOD GLUCOSE SYSTEM) w/Device KIT Use to check glucose TID. 1 each 0   CONTOUR NEXT TEST test strip USE TO CHECK GLUCOSE 3 TIMES A DAY 100 strip 12   dapagliflozin propanediol (FARXIGA) 10 MG TABS tablet Take 1 tablet (10 mg total) by mouth daily before breakfast. 90 tablet 3   glucose blood (ONE TOUCH ULTRA TEST) test strip 1 each by Other route 2 (two) times daily as needed for other. 100 each 5   metFORMIN (GLUCOPHAGE) 1000 MG tablet Take 1 tablet (1,000 mg total) by mouth 2 (two) times daily with a meal. 180 tablet 3   ONETOUCH DELICA LANCETS FINE MISC Inject 1 each into the skin 2 (two) times daily as needed. 100 each 5   OZEMPIC, 1 MG/DOSE, 4 MG/3ML SOPN INJECT SUBCUTANEOUSLY 1 MG  EVERY WEEK 9 mL 3   lisinopril (ZESTRIL) 10 MG tablet Take 1 tablet (10 mg total) by mouth daily. 90 tablet 3   fluticasone (FLONASE) 50 MCG/ACT nasal spray Place 2 sprays into both nostrils daily. (Patient not taking: Reported on 08/23/2021) 16 g 0   No facility-administered medications prior to visit.    Allergies  Allergen Reactions   Penicillins     ROS Review of Systems  Constitutional:  Negative for diaphoresis, fatigue, fever and unexpected weight change.  HENT: Negative.    Eyes:  Negative for photophobia and visual disturbance.  Respiratory: Negative.    Cardiovascular: Negative.   Gastrointestinal: Negative.   Endocrine: Negative for polyphagia and polyuria.  Genitourinary: Negative.   Neurological:  Negative for speech difficulty, weakness and light-headedness.  Psychiatric/Behavioral: Negative.       Objective:    Physical Exam Vitals and nursing note reviewed.  Constitutional:      General: He is not in acute distress.    Appearance: Normal appearance. He is not ill-appearing, toxic-appearing or diaphoretic.  HENT:     Head: Normocephalic and atraumatic.     Right Ear: Tympanic membrane, ear canal and external  ear normal.     Left Ear: Tympanic membrane, ear canal and external ear normal.     Mouth/Throat:     Mouth: Mucous membranes are moist.     Pharynx: Oropharynx is clear. No oropharyngeal exudate or posterior oropharyngeal erythema.  Eyes:     Extraocular Movements: Extraocular movements intact.     Conjunctiva/sclera: Conjunctivae normal.     Pupils: Pupils are equal, round, and reactive to light.  Neck:     Vascular: No carotid bruit.  Cardiovascular:     Rate and Rhythm: Normal rate and regular rhythm.     Pulses:          Dorsalis pedis pulses are 2+ on the right side and 2+ on the left side.       Posterior tibial pulses are 2+ on the right side and 2+ on the left side.  Pulmonary:     Effort: Pulmonary effort is normal.     Breath sounds: Normal breath sounds.  Abdominal:     General: Bowel sounds are normal. There is no distension.     Palpations: There is no mass.  Musculoskeletal:     Cervical back: No rigidity or tenderness.     Right lower leg: No edema.     Left lower leg: No edema.  Lymphadenopathy:     Cervical: No cervical adenopathy.  Skin:    General: Skin is warm and dry.  Neurological:     General: No focal deficit present.     Mental Status: He is alert and oriented to person, place, and time.  Psychiatric:        Mood and Affect: Mood normal.        Behavior: Behavior normal.   Diabetic Foot Exam - Simple   Simple Foot Form Diabetic Foot exam was performed with the following findings: Yes 08/23/2021  8:25 AM  Visual Inspection See comments: Yes Sensation Testing Intact to touch and monofilament testing bilaterally: Yes Pulse Check Posterior Tibialis and Dorsalis pulse intact bilaterally: Yes Comments Feet are cavus.  There are no rashes or wounds.     BP (!) 142/72 (BP Location: Right Arm, Patient Position: Sitting, Cuff Size: Normal)    Pulse 88    Temp (!) 97.5 F (36.4 C) (Temporal)    Ht _0  (1.803 m)    Wt 199 lb 6.4 oz (90.4 kg)    SpO2  97%    BMI 27.81 kg/m  Wt Readings from Last 3 Encounters:  08/23/21 199 lb 6.4 oz (90.4 kg)  07/19/21 199 lb 2 oz (90.3 kg)  04/22/21 198 lb 12.8 oz (90.2 kg)     Health  Maintenance Due  Topic Date Due   Hepatitis C Screening  Never done   COLONOSCOPY (Pts 45-89yr Insurance coverage will need to be confirmed)  Never done   Zoster Vaccines- Shingrix (1 of 2) Never done    There are no preventive care reminders to display for this patient.  Lab Results  Component Value Date   TSH 1.27 01/12/2018   Lab Results  Component Value Date   WBC 5.5 04/05/2021   HGB 17.0 04/05/2021   HCT 49.0 04/05/2021   MCV 84.8 04/05/2021   PLT 168.0 04/05/2021   Lab Results  Component Value Date   NA 137 04/05/2021   K 3.9 04/05/2021   CO2 28 04/05/2021   GLUCOSE 202 (H) 04/05/2021   BUN 19 04/05/2021   CREATININE 0.91 04/05/2021   BILITOT 1.1 04/05/2021   ALKPHOS 91 04/05/2021   AST 24 04/05/2021   ALT 27 04/05/2021   PROT 7.7 04/05/2021   ALBUMIN 4.5 04/05/2021   CALCIUM 9.2 04/05/2021   GFR 97.61 04/05/2021   Lab Results  Component Value Date   CHOL 94 04/05/2021   Lab Results  Component Value Date   HDL 26.60 (L) 04/05/2021   Lab Results  Component Value Date   LDLCALC 30 04/05/2021   Lab Results  Component Value Date   TRIG 188.0 (H) 04/05/2021   Lab Results  Component Value Date   CHOLHDL 4 04/05/2021   Lab Results  Component Value Date   HGBA1C 7.5 (H) 04/05/2021      Assessment & Plan:   Problem List Items Addressed This Visit       Cardiovascular and Mediastinum   Essential hypertension   Relevant Medications   lisinopril (ZESTRIL) 20 MG tablet   Other Relevant Orders   Basic metabolic panel   Urinalysis, Routine w reflex microscopic   Microalbumin / creatinine urine ratio     Endocrine   Type 2 diabetes mellitus without complication, without long-term current use of insulin (HCC) - Primary   Relevant Medications   lisinopril (ZESTRIL) 20  MG tablet   Other Relevant Orders   Basic metabolic panel   Hemoglobin A1c   Urinalysis, Routine w reflex microscopic   Microalbumin / creatinine urine ratio     Other   Screening for prostate cancer   Relevant Orders   PSA   Elevated LDL cholesterol level   Relevant Orders   Lipid panel    Meds ordered this encounter  Medications   DISCONTD: lisinopril (ZESTRIL) 20 MG tablet    Sig: Take 1 tablet (20 mg total) by mouth daily.    Dispense:  90 tablet    Refill:  3   lisinopril (ZESTRIL) 20 MG tablet    Sig: Take 1 tablet (20 mg total) by mouth daily.    Dispense:  90 tablet    Refill:  3    Follow-up: Return in about 4 months (around 12/21/2021).   Have increased lisinopril to 20 mg daily.  Patient will be mindful of his sodium and carbohydrate intake.  He is planning on returning to the gym.  Continue all medications as directed. WLibby Maw MD

## 2021-12-20 ENCOUNTER — Ambulatory Visit: Payer: 59 | Admitting: Family Medicine

## 2021-12-27 ENCOUNTER — Encounter: Payer: Self-pay | Admitting: Family Medicine

## 2021-12-27 ENCOUNTER — Ambulatory Visit: Payer: 59 | Admitting: Family Medicine

## 2021-12-27 VITALS — BP 128/76 | HR 103 | Temp 97.0°F | Ht 71.0 in | Wt 205.2 lb

## 2021-12-27 DIAGNOSIS — E7800 Pure hypercholesterolemia, unspecified: Secondary | ICD-10-CM | POA: Diagnosis not present

## 2021-12-27 DIAGNOSIS — I10 Essential (primary) hypertension: Secondary | ICD-10-CM

## 2021-12-27 DIAGNOSIS — Z125 Encounter for screening for malignant neoplasm of prostate: Secondary | ICD-10-CM

## 2021-12-27 DIAGNOSIS — E119 Type 2 diabetes mellitus without complications: Secondary | ICD-10-CM

## 2021-12-27 LAB — BASIC METABOLIC PANEL
BUN: 14 mg/dL (ref 6–23)
CO2: 27 mEq/L (ref 19–32)
Calcium: 9.4 mg/dL (ref 8.4–10.5)
Chloride: 103 mEq/L (ref 96–112)
Creatinine, Ser: 0.87 mg/dL (ref 0.40–1.50)
GFR: 99.42 mL/min (ref >60.00)
Glucose, Bld: 146 mg/dL — ABNORMAL HIGH (ref 70–99)
Potassium: 4.1 mEq/L (ref 3.5–5.1)
Sodium: 140 mEq/L (ref 135–145)

## 2021-12-27 LAB — HEMOGLOBIN A1C: Hgb A1c MFr Bld: 7.9 % — ABNORMAL HIGH (ref 4.6–6.5)

## 2021-12-27 LAB — LDL CHOLESTEROL, DIRECT: Direct LDL: 58 mg/dL

## 2021-12-27 LAB — PSA: PSA: 0.4 ng/mL (ref 0.10–4.00)

## 2022-01-01 ENCOUNTER — Encounter: Payer: Self-pay | Admitting: Family Medicine

## 2022-02-10 ENCOUNTER — Other Ambulatory Visit: Payer: Self-pay | Admitting: Family Medicine

## 2022-02-10 DIAGNOSIS — E7800 Pure hypercholesterolemia, unspecified: Secondary | ICD-10-CM

## 2022-02-10 DIAGNOSIS — E119 Type 2 diabetes mellitus without complications: Secondary | ICD-10-CM

## 2022-04-20 ENCOUNTER — Other Ambulatory Visit: Payer: Self-pay | Admitting: Family Medicine

## 2022-04-20 DIAGNOSIS — E119 Type 2 diabetes mellitus without complications: Secondary | ICD-10-CM

## 2022-05-02 ENCOUNTER — Encounter: Payer: Self-pay | Admitting: Family Medicine

## 2022-05-02 ENCOUNTER — Ambulatory Visit: Payer: 59 | Admitting: Family Medicine

## 2022-05-02 VITALS — BP 124/72 | HR 93 | Temp 97.9°F | Ht 71.0 in | Wt 205.2 lb

## 2022-05-02 DIAGNOSIS — I10 Essential (primary) hypertension: Secondary | ICD-10-CM

## 2022-05-02 DIAGNOSIS — R110 Nausea: Secondary | ICD-10-CM

## 2022-05-02 DIAGNOSIS — E119 Type 2 diabetes mellitus without complications: Secondary | ICD-10-CM

## 2022-05-02 LAB — BASIC METABOLIC PANEL
BUN: 22 mg/dL (ref 6–23)
CO2: 27 mEq/L (ref 19–32)
Calcium: 10 mg/dL (ref 8.4–10.5)
Chloride: 93 mEq/L — ABNORMAL LOW (ref 96–112)
Creatinine, Ser: 1.08 mg/dL (ref 0.40–1.50)
GFR: 78.88 mL/min (ref >60.00)
Glucose, Bld: 159 mg/dL — ABNORMAL HIGH (ref 70–99)
Potassium: 4.7 mEq/L (ref 3.5–5.1)
Sodium: 131 mEq/L — ABNORMAL LOW (ref 135–145)

## 2022-05-02 LAB — HEMOGLOBIN A1C: Hgb A1c MFr Bld: 8 % — ABNORMAL HIGH (ref 4.6–6.5)

## 2022-05-02 NOTE — Progress Notes (Signed)
Established Patient Office Visit  Subjective   Patient ID: Eric Sellers, male    DOB: 03-20-1971  Age: 51 y.o. MRN: [id number]  Chief Complaint  Patient presents with   Follow-up    4 month f/u.  C/o having upset stomach in the AM.  Declines flu shot.     HPI follow-up of hypertension and diabetes.  Blood pressure well controlled with 20 mg of lisinopril.  Tolerating the drug well.  Continues Farxiga 10 mg, semaglutide and metformin 1000 mg twice daily.  Has been having some problems with nausea in the morning.  Does admit some carbohydrate indulgence.    Review of Systems  Constitutional: Negative.   HENT: Negative.    Eyes:  Negative for blurred vision, discharge and redness.  Respiratory: Negative.    Cardiovascular: Negative.   Gastrointestinal:  Positive for nausea. Negative for abdominal pain and vomiting.  Genitourinary: Negative.   Musculoskeletal: Negative.  Negative for myalgias.  Skin:  Negative for rash.  Neurological:  Negative for tingling, loss of consciousness and weakness.  Endo/Heme/Allergies:  Negative for polydipsia.      Objective:     BP 124/72   Pulse 93   Temp 97.9 F (36.6 C) (Temporal)   Ht 5\' 11"  (1.803 m)   Wt 205 lb 3.2 oz (93.1 kg)   SpO2 98%   BMI 28.62 kg/m  Wt Readings from Last 3 Encounters:  05/02/22 205 lb 3.2 oz (93.1 kg)  12/27/21 205 lb 3.2 oz (93.1 kg)  08/23/21 199 lb 6.4 oz (90.4 kg)      Physical Exam Constitutional:      General: He is not in acute distress.    Appearance: Normal appearance. He is not ill-appearing, toxic-appearing or diaphoretic.  HENT:     Head: Normocephalic and atraumatic.     Right Ear: External ear normal.     Left Ear: External ear normal.  Eyes:     General: No scleral icterus.       Right eye: No discharge.        Left eye: No discharge.     Extraocular Movements: Extraocular movements intact.     Conjunctiva/sclera: Conjunctivae normal.  Pulmonary:     Effort: Pulmonary effort is  normal. No respiratory distress.  Skin:    General: Skin is warm and dry.  Neurological:     Mental Status: He is alert and oriented to person, place, and time.  Psychiatric:        Mood and Affect: Mood normal.        Behavior: Behavior normal.      No results found for any visits on 05/02/22.    The ASCVD Risk score (Arnett DK, et al., 2019) failed to calculate for the following reasons:   The valid total cholesterol range is 130 to 320 mg/dL    Assessment & Plan:   Problem List Items Addressed This Visit       Cardiovascular and Mediastinum   Essential hypertension     Endocrine   Type 2 diabetes mellitus without complication, without long-term current use of insulin (HCC) - Primary   Other Visit Diagnoses     Nausea           Return in about 3 months (around 08/02/2022), or May try OTC Pepcid for stomach upset.  May try holding Farxiga for 1 week to see if this helps his nausea.  If it helps he will let me know.  Discussed that high carbohydrate  loads with semaglutide can also cause nausea.  He will make an effort to cut back on his carbohydrate intake.  Continue lisinopril 20 mg daily for blood pressure.  Libby Maw, MD

## 2022-06-02 ENCOUNTER — Other Ambulatory Visit: Payer: Self-pay | Admitting: Family Medicine

## 2022-06-02 DIAGNOSIS — I10 Essential (primary) hypertension: Secondary | ICD-10-CM

## 2022-06-02 DIAGNOSIS — E119 Type 2 diabetes mellitus without complications: Secondary | ICD-10-CM

## 2022-07-03 ENCOUNTER — Other Ambulatory Visit: Payer: Self-pay | Admitting: Family Medicine

## 2022-07-03 DIAGNOSIS — E119 Type 2 diabetes mellitus without complications: Secondary | ICD-10-CM

## 2022-08-01 ENCOUNTER — Encounter: Payer: Self-pay | Admitting: Family Medicine

## 2022-08-01 ENCOUNTER — Ambulatory Visit: Payer: 59 | Admitting: Family Medicine

## 2022-08-01 VITALS — BP 132/84 | HR 95 | Temp 97.2°F | Ht 71.0 in | Wt 204.8 lb

## 2022-08-01 DIAGNOSIS — E7800 Pure hypercholesterolemia, unspecified: Secondary | ICD-10-CM | POA: Diagnosis not present

## 2022-08-01 DIAGNOSIS — E119 Type 2 diabetes mellitus without complications: Secondary | ICD-10-CM | POA: Diagnosis not present

## 2022-08-01 DIAGNOSIS — I10 Essential (primary) hypertension: Secondary | ICD-10-CM

## 2022-08-01 DIAGNOSIS — Z23 Encounter for immunization: Secondary | ICD-10-CM

## 2022-08-01 LAB — BASIC METABOLIC PANEL
BUN: 17 mg/dL (ref 6–23)
CO2: 27 mEq/L (ref 19–32)
Calcium: 9.3 mg/dL (ref 8.4–10.5)
Chloride: 99 mEq/L (ref 96–112)
Creatinine, Ser: 0.84 mg/dL (ref 0.40–1.50)
GFR: 100.06 mL/min (ref >60.00)
Glucose, Bld: 149 mg/dL — ABNORMAL HIGH (ref 70–99)
Potassium: 4.3 mEq/L (ref 3.5–5.1)
Sodium: 136 mEq/L (ref 135–145)

## 2022-08-01 LAB — URINALYSIS, ROUTINE W REFLEX MICROSCOPIC
Bilirubin Urine: NEGATIVE
Hgb urine dipstick: NEGATIVE
Ketones, ur: 15 — AB
Leukocytes,Ua: NEGATIVE
Nitrite: NEGATIVE
Specific Gravity, Urine: 1.02 (ref 1.000–1.030)
Total Protein, Urine: NEGATIVE
Urine Glucose: >=1000 — AB
Urobilinogen, UA: 0.2 (ref 0.0–1.0)
WBC, UA: NONE SEEN — AB (ref 0–?)
pH: 5.5 (ref 5.0–8.0)

## 2022-08-01 LAB — MICROALBUMIN / CREATININE URINE RATIO
Creatinine,U: 72 mg/dL
Microalb Creat Ratio: 1 mg/g (ref 0.0–30.0)
Microalb, Ur: <0.7 mg/dL (ref 0.0–1.9)

## 2022-08-01 LAB — LIPID PANEL
Cholesterol: 118 mg/dL (ref 0–200)
HDL: 35.5 mg/dL — ABNORMAL LOW (ref >39.00)
LDL Cholesterol: 62 mg/dL (ref 0–99)
NonHDL: 82.7
Total CHOL/HDL Ratio: 3
Triglycerides: 102 mg/dL (ref 0.0–149.0)
VLDL: 20.4 mg/dL (ref 0.0–40.0)

## 2022-08-01 LAB — HEMOGLOBIN A1C: Hgb A1c MFr Bld: 7.6 % — ABNORMAL HIGH (ref 4.6–6.5)

## 2022-08-01 NOTE — Progress Notes (Signed)
Established Patient Office Visit   Subjective:  Patient ID: Eric Sellers, male    DOB: 1971/02/09  Age: 52 y.o. MRN: [id number]  Chief Complaint  Patient presents with   Follow-up    3 month follow up, no concerns. Patient fasting.     HPI Encounter Diagnoses  Name Primary?   Immunization due Yes   Type 2 diabetes mellitus without complication, without long-term current use of insulin (HCC)    Essential hypertension    Elevated LDL cholesterol level    For follow-up of the above.  Tolerating all of his medicines.  He started to workout and decrease the carbohydrate content in his diet.  Blood pressure is responded well.  There is hope that his diabetes has as well.   ROS   Current Outpatient Medications:    atorvastatin (LIPITOR) 20 MG tablet, TAKE 1 TABLET BY MOUTH  DAILY, Disp: 90 tablet, Rfl: 3   Blood Glucose Monitoring Suppl (CONTOUR BLOOD GLUCOSE SYSTEM) w/Device KIT, Use to check glucose TID., Disp: 1 each, Rfl: 0   CONTOUR NEXT TEST test strip, USE TO CHECK GLUCOSE 3 TIMES A DAY, Disp: 100 strip, Rfl: 12   FARXIGA 10 MG TABS tablet, TAKE 1 TABLET BY MOUTH  DAILY BEFORE BREAKFAST, Disp: 90 tablet, Rfl: 3   glucose blood (ONE TOUCH ULTRA TEST) test strip, 1 each by Other route 2 (two) times daily as needed for other., Disp: 100 each, Rfl: 5   lisinopril (ZESTRIL) 20 MG tablet, TAKE 1 TABLET BY MOUTH DAILY, Disp: 90 tablet, Rfl: 3   metFORMIN (GLUCOPHAGE) 1000 MG tablet, TAKE 1 TABLET BY MOUTH  TWICE DAILY WITH MEALS, Disp: 180 tablet, Rfl: 3   ONETOUCH DELICA LANCETS FINE MISC, Inject 1 each into the skin 2 (two) times daily as needed., Disp: 100 each, Rfl: 5   Semaglutide, 1 MG/DOSE, (OZEMPIC, 1 MG/DOSE,) 4 MG/3ML SOPN, INJECT SUBCUTANEOUSLY 1 MG  WEEKLY, Disp: 9 mL, Rfl: 1   aspirin EC 81 MG tablet, Take 81 mg by mouth daily. Swallow whole. (Patient not taking: Reported on 08/01/2022), Disp: , Rfl:    Objective:     BP 132/84 (BP Location: Right Arm, Patient  Position: Sitting, Cuff Size: Normal)   Pulse 95   Temp (!) 97.2 F (36.2 C) (Temporal)   Ht 5\' 11"  (1.803 m)   Wt 204 lb 12.8 oz (92.9 kg)   SpO2 97%   BMI 28.56 kg/m  BP Readings from Last 3 Encounters:  08/01/22 132/84  05/02/22 124/72  12/27/21 128/76   Wt Readings from Last 3 Encounters:  08/01/22 204 lb 12.8 oz (92.9 kg)  05/02/22 205 lb 3.2 oz (93.1 kg)  12/27/21 205 lb 3.2 oz (93.1 kg)      Physical Exam   No results found for any visits on 08/01/22.    The ASCVD Risk score (Arnett DK, et al., 2019) failed to calculate for the following reasons:   The valid total cholesterol range is 130 to 320 mg/dL    Assessment & Plan:   Immunization due -     Varicella-zoster vaccine IM  Type 2 diabetes mellitus without complication, without long-term current use of insulin (HCC) -     Basic metabolic panel -     Hemoglobin A1c -     Urinalysis, Routine w reflex microscopic -     Microalbumin / creatinine urine ratio  Essential hypertension -     Basic metabolic panel -     Urinalysis, Routine  w reflex microscopic -     Microalbumin / creatinine urine ratio  Elevated LDL cholesterol level -     Lipid panel    Return in about 3 months (around 10/31/2022).  Discussed increasing semaglutide to and he would be okay with that.  Will continue his weight loss during increasing exercise.  Remind him to him to avoid carbohydrate pends.  First Shingrix today.  Continue all medicines as above  Libby Maw, MD

## 2022-08-15 LAB — HM DIABETES EYE EXAM

## 2022-08-20 DIAGNOSIS — E11319 Type 2 diabetes mellitus with unspecified diabetic retinopathy without macular edema: Secondary | ICD-10-CM

## 2022-10-10 ENCOUNTER — Encounter: Payer: Self-pay | Admitting: Family Medicine

## 2022-10-24 ENCOUNTER — Ambulatory Visit: Payer: 59 | Admitting: Family Medicine

## 2022-11-07 ENCOUNTER — Ambulatory Visit: Payer: 59 | Admitting: Family Medicine

## 2022-11-21 ENCOUNTER — Other Ambulatory Visit: Payer: Self-pay | Admitting: Family Medicine

## 2022-11-21 DIAGNOSIS — E119 Type 2 diabetes mellitus without complications: Secondary | ICD-10-CM

## 2022-11-28 ENCOUNTER — Encounter: Payer: Self-pay | Admitting: Family Medicine

## 2022-11-28 ENCOUNTER — Ambulatory Visit: Payer: 59 | Admitting: Family Medicine

## 2022-11-28 VITALS — BP 122/80 | HR 99 | Temp 98.2°F | Ht 71.0 in | Wt 208.3 lb

## 2022-11-28 DIAGNOSIS — Z23 Encounter for immunization: Secondary | ICD-10-CM

## 2022-11-28 DIAGNOSIS — Z7985 Long-term (current) use of injectable non-insulin antidiabetic drugs: Secondary | ICD-10-CM

## 2022-11-28 DIAGNOSIS — I10 Essential (primary) hypertension: Secondary | ICD-10-CM | POA: Diagnosis not present

## 2022-11-28 DIAGNOSIS — E7800 Pure hypercholesterolemia, unspecified: Secondary | ICD-10-CM

## 2022-11-28 DIAGNOSIS — E119 Type 2 diabetes mellitus without complications: Secondary | ICD-10-CM | POA: Diagnosis not present

## 2022-11-28 LAB — BASIC METABOLIC PANEL
BUN: 15 mg/dL (ref 6–23)
CO2: 27 mEq/L (ref 19–32)
Calcium: 9.6 mg/dL (ref 8.4–10.5)
Chloride: 100 mEq/L (ref 96–112)
Creatinine, Ser: 0.94 mg/dL (ref 0.40–1.50)
GFR: 92.8 mL/min (ref >60.00)
Glucose, Bld: 162 mg/dL — ABNORMAL HIGH (ref 70–99)
Potassium: 4.5 mEq/L (ref 3.5–5.1)
Sodium: 137 mEq/L (ref 135–145)

## 2022-11-28 LAB — HEMOGLOBIN A1C: Hgb A1c MFr Bld: 8.1 % — ABNORMAL HIGH (ref 4.6–6.5)

## 2022-11-28 NOTE — Progress Notes (Signed)
Established Patient Office Visit   Subjective:  Patient ID: Eric Sellers, male    DOB: Nov 26, 1970  Age: 52 y.o. MRN: [id number]  Chief Complaint  Patient presents with   Medical Management of Chronic Issues    3 month follow up, no concerns. Patient fasting.     HPI Encounter Diagnoses  Name Primary?   Type 2 diabetes mellitus without complication, without long-term current use of insulin (HCC) Yes   Immunization due    Essential hypertension    Elevated LDL cholesterol level    For follow-up of above.  Increased work hours have led to increased stress which has increased food intake.  He has gained a couple pounds.  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   Review of Systems  Constitutional: Negative.   HENT: Negative.    Eyes:  Negative for blurred vision, discharge and redness.  Respiratory: Negative.    Cardiovascular: Negative.   Gastrointestinal:  Negative for abdominal pain.  Genitourinary: Negative.   Musculoskeletal: Negative.  Negative for myalgias.  Skin:  Negative for rash.  Neurological:  Negative for tingling, loss of consciousness and weakness.  Endo/Heme/Allergies:  Negative for polydipsia.     Current Outpatient Medications:    aspirin EC 81 MG tablet, Take 81 mg by mouth daily. Swallow whole., Disp: , Rfl:    atorvastatin (LIPITOR) 20 MG tablet, TAKE 1 TABLET BY MOUTH  DAILY, Disp: 90 tablet, Rfl: 3   Blood Glucose Monitoring Suppl (CONTOUR BLOOD GLUCOSE SYSTEM) w/Device KIT, Use to check glucose TID., Disp: 1 each, Rfl: 0   CONTOUR NEXT TEST test strip, USE TO CHECK GLUCOSE 3 TIMES A DAY, Disp: 100 strip, Rfl: 12   FARXIGA 10 MG TABS tablet, TAKE 1 TABLET BY MOUTH  DAILY BEFORE BREAKFAST, Disp: 90 tablet, Rfl: 3   glucose blood (ONE TOUCH ULTRA TEST) test strip, 1 each by Other route 2 (two) times daily as needed for other., Disp: 100 each, Rfl: 5   lisinopril (ZESTRIL) 20 MG tablet, TAKE 1 TABLET BY MOUTH DAILY, Disp: 90  tablet, Rfl: 3   metFORMIN (GLUCOPHAGE) 1000 MG tablet, TAKE 1 TABLET BY MOUTH  TWICE DAILY WITH MEALS, Disp: 180 tablet, Rfl: 3   ONETOUCH DELICA LANCETS FINE MISC, Inject 1 each into the skin 2 (two) times daily as needed., Disp: 100 each, Rfl: 5   OZEMPIC, 1 MG/DOSE, 4 MG/3ML SOPN, INJECT SUBCUTANEOUSLY 1 MG EVERY WEEK, Disp: 9 mL, Rfl: 3   Objective:     BP 122/80 (BP Location: Right Arm, Patient Position: Sitting, Cuff Size: Normal)   Pulse 99   Temp 98.2 F (36.8 C) (Temporal)   Ht 5\' 11"  (1.803 m)   Wt 208 lb 4.8 oz (94.5 kg)   SpO2 97%   BMI 29.05 kg/m  BP Readings from Last 3 Encounters:  11/28/22 122/80  08/01/22 132/84  05/02/22 124/72   Wt Readings from Last 3 Encounters:  11/28/22 208 lb 4.8 oz (94.5 kg)  08/01/22 204 lb 12.8 oz (92.9 kg)  05/02/22 205 lb 3.2 oz (93.1 kg)      Physical Exam Constitutional:      General: He is not in acute distress.    Appearance: Normal appearance. He is not ill-appearing, toxic-appearing or diaphoretic.  HENT:     Head: Normocephalic and atraumatic.     Right Ear: External ear normal.     Left Ear: External ear normal.  Eyes:     General:  No scleral icterus.       Right eye: No discharge.        Left eye: No discharge.     Extraocular Movements: Extraocular movements intact.     Conjunctiva/sclera: Conjunctivae normal.  Cardiovascular:     Rate and Rhythm: Normal rate and regular rhythm.     Pulses:          Dorsalis pedis pulses are 2+ on the right side and 2+ on the left side.       Posterior tibial pulses are 2+ on the right side and 2+ on the left side.  Pulmonary:     Effort: Pulmonary effort is normal. No respiratory distress.     Breath sounds: Normal breath sounds.  Skin:    General: Skin is warm and dry.  Neurological:     Mental Status: He is alert and oriented to person, place, and time.  Psychiatric:        Mood and Affect: Mood normal.        Behavior: Behavior normal.    Diabetic Foot Exam -  Simple   Simple Foot Form Diabetic Foot exam was performed with the following findings: Yes 11/28/2022  9:00 AM  Visual Inspection See comments: Yes Sensation Testing Intact to touch and monofilament testing bilaterally: Yes Pulse Check Posterior Tibialis and Dorsalis pulse intact bilaterally: Yes Comments Feet are cavus bilaterally.  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      No results found for any visits on 11/28/22.    The ASCVD Risk score (Arnett DK, et al., 2019) failed to calculate for the following reasons:   The valid total cholesterol range is 130 to 320 mg/dL    Assessment & Plan:   Type 2 diabetes mellitus without complication, without long-term current use of insulin (HCC) -     Basic metabolic panel -     Hemoglobin A1c  Immunization due -     Varicella-zoster vaccine IM  Essential hypertension -     Basic metabolic panel  Elevated LDL cholesterol level    Return in about 4 months (around 03/31/2023).  Continue current medications.  He will work to lower the carbohydrate intake and try to lose some weight.  Could consider increasing [place].  Could consider increasing semaglutide.  Blood pressure well-controlled on current regimen.  LDL cholesterol well-controlled with atorvastatin.  Mliss Sax, MD

## 2023-01-25 ENCOUNTER — Other Ambulatory Visit: Payer: Self-pay | Admitting: Family Medicine

## 2023-01-25 DIAGNOSIS — E119 Type 2 diabetes mellitus without complications: Secondary | ICD-10-CM

## 2023-01-25 DIAGNOSIS — E7800 Pure hypercholesterolemia, unspecified: Secondary | ICD-10-CM

## 2023-02-20 ENCOUNTER — Other Ambulatory Visit: Payer: Self-pay | Admitting: Family Medicine

## 2023-02-20 DIAGNOSIS — E119 Type 2 diabetes mellitus without complications: Secondary | ICD-10-CM

## 2023-04-03 ENCOUNTER — Ambulatory Visit: Payer: 59 | Admitting: Family Medicine

## 2023-04-10 ENCOUNTER — Ambulatory Visit: Payer: 59 | Admitting: Family Medicine

## 2023-04-10 ENCOUNTER — Encounter: Payer: Self-pay | Admitting: Family Medicine

## 2023-04-10 VITALS — BP 136/82 | HR 68 | Temp 98.2°F | Ht 71.0 in | Wt 221.6 lb

## 2023-04-10 DIAGNOSIS — I10 Essential (primary) hypertension: Secondary | ICD-10-CM | POA: Diagnosis not present

## 2023-04-10 DIAGNOSIS — E66811 Obesity, class 1: Secondary | ICD-10-CM | POA: Insufficient documentation

## 2023-04-10 DIAGNOSIS — E119 Type 2 diabetes mellitus without complications: Secondary | ICD-10-CM | POA: Diagnosis not present

## 2023-04-10 DIAGNOSIS — Z6830 Body mass index (BMI) 30.0-30.9, adult: Secondary | ICD-10-CM | POA: Diagnosis not present

## 2023-04-10 LAB — BASIC METABOLIC PANEL
BUN: 15 mg/dL (ref 6–23)
CO2: 26 meq/L (ref 19–32)
Calcium: 9.3 mg/dL (ref 8.4–10.5)
Chloride: 102 meq/L (ref 96–112)
Creatinine, Ser: 0.82 mg/dL (ref 0.40–1.50)
GFR: 100.3 mL/min (ref >60.00)
Glucose, Bld: 144 mg/dL — ABNORMAL HIGH (ref 70–99)
Potassium: 4.5 meq/L (ref 3.5–5.1)
Sodium: 138 meq/L (ref 135–145)

## 2023-04-10 LAB — HEMOGLOBIN A1C: Hgb A1c MFr Bld: 7.4 % — ABNORMAL HIGH (ref 4.6–6.5)

## 2023-04-10 MED ORDER — SEMAGLUTIDE (2 MG/DOSE) 8 MG/3ML ~~LOC~~ SOPN: 2.0000 mg | PEN_INJECTOR | SUBCUTANEOUS | 1 refills | Status: DC

## 2023-04-10 NOTE — Progress Notes (Signed)
Established Patient Office Visit   Subjective:  Patient ID: Eric Sellers, male    DOB: 1971-06-25  Age: 52 y.o. MRN: [id number]  Chief Complaint  Patient presents with   Medical Management of Chronic Issues    4 month follow up. Pt is fasting. Pt wants to discus increasing ozempic. Weight is coming back on.     HPI Encounter Diagnoses  Name Primary?   Essential hypertension Yes   Type 2 diabetes mellitus without complication, without long-term current use of insulin (HCC)    Obesity, Class I, BMI 30-34.9    For follow-up of above.  Blood pressure well-controlled with lisinopril but has been up recently secondary to 18 pound weight gain.  Patient has been going to the gym 3 times weekly and achieving 150 minutes of exercise that includes aerobic and strength training.  He asks if we could increase the semaglutide dosage.  He is tolerating the drug well.  Occasional stomach upset with it and that is about it.  Continues with Marcelline Deist and metformin.   Review of Systems  Constitutional: Negative.   HENT: Negative.    Eyes:  Negative for blurred vision, discharge and redness.  Respiratory: Negative.    Cardiovascular: Negative.   Gastrointestinal:  Negative for abdominal pain.  Genitourinary: Negative.   Musculoskeletal: Negative.  Negative for myalgias.  Skin:  Negative for rash.  Neurological:  Negative for tingling, loss of consciousness and weakness.  Endo/Heme/Allergies:  Negative for polydipsia.     Current Outpatient Medications:    aspirin EC 81 MG tablet, Take 81 mg by mouth daily. Swallow whole., Disp: , Rfl:    atorvastatin (LIPITOR) 20 MG tablet, TAKE 1 TABLET BY MOUTH ONCE  DAILY, Disp: 90 tablet, Rfl: 3   Blood Glucose Monitoring Suppl (CONTOUR BLOOD GLUCOSE SYSTEM) w/Device KIT, Use to check glucose TID., Disp: 1 each, Rfl: 0   CONTOUR NEXT TEST test strip, USE TO CHECK GLUCOSE 3 TIMES A DAY, Disp: 100 strip, Rfl: 12   FARXIGA 10 MG TABS tablet, TAKE 1 TABLET BY  MOUTH DAILY  BEFORE BREAKFAST, Disp: 90 tablet, Rfl: 3   glucose blood (ONE TOUCH ULTRA TEST) test strip, 1 each by Other route 2 (two) times daily as needed for other., Disp: 100 each, Rfl: 5   lisinopril (ZESTRIL) 20 MG tablet, TAKE 1 TABLET BY MOUTH DAILY, Disp: 90 tablet, Rfl: 3   metFORMIN (GLUCOPHAGE) 1000 MG tablet, TAKE 1 TABLET BY MOUTH TWICE  DAILY WITH MEALS, Disp: 180 tablet, Rfl: 3   ONETOUCH DELICA LANCETS FINE MISC, Inject 1 each into the skin 2 (two) times daily as needed., Disp: 100 each, Rfl: 5   OZEMPIC, 1 MG/DOSE, 4 MG/3ML SOPN, INJECT SUBCUTANEOUSLY 1 MG EVERY WEEK, Disp: 9 mL, Rfl: 3   Semaglutide, 2 MG/DOSE, 8 MG/3ML SOPN, Inject 2 mg as directed once a week., Disp: 9 mL, Rfl: 1   Objective:     BP 136/82   Pulse 68   Temp 98.2 F (36.8 C)   Ht 5\' 11"  (1.803 m)   Wt 221 lb 9.6 oz (100.5 kg)   SpO2 99%   BMI 30.91 kg/m  BP Readings from Last 3 Encounters:  04/10/23 136/82  11/28/22 122/80  08/01/22 132/84   Wt Readings from Last 3 Encounters:  04/10/23 221 lb 9.6 oz (100.5 kg)  11/28/22 208 lb 4.8 oz (94.5 kg)  08/01/22 204 lb 12.8 oz (92.9 kg)      Physical Exam Constitutional:  General: He is not in acute distress.    Appearance: Normal appearance. He is not ill-appearing, toxic-appearing or diaphoretic.  HENT:     Head: Normocephalic and atraumatic.     Right Ear: External ear normal.     Left Ear: External ear normal.  Eyes:     General: No scleral icterus.       Right eye: No discharge.        Left eye: No discharge.     Extraocular Movements: Extraocular movements intact.     Conjunctiva/sclera: Conjunctivae normal.  Pulmonary:     Effort: Pulmonary effort is normal. No respiratory distress.  Skin:    General: Skin is warm and dry.  Neurological:     Mental Status: He is alert and oriented to person, place, and time.  Psychiatric:        Mood and Affect: Mood normal.        Behavior: Behavior normal.      No results found for  any visits on 04/10/23.    The ASCVD Risk score (Arnett DK, et al., 2019) failed to calculate for the following reasons:   The valid total cholesterol range is 130 to 320 mg/dL    Assessment & Plan:   Essential hypertension -     Basic metabolic panel  Type 2 diabetes mellitus without complication, without long-term current use of insulin (HCC) -     Semaglutide (2 MG/DOSE); Inject 2 mg as directed once a week.  Dispense: 9 mL; Refill: 1 -     Basic metabolic panel -     Hemoglobin A1c  Obesity, Class I, BMI 30-34.9    Return in about 14 weeks (around 07/17/2023).  Continue Farxiga, metformin and have increased semaglutide to 2 mg weekly.  Continue lisinopril 20 hypertension.  Will continue going to the gym and exercising.  Mliss Sax, MD

## 2023-04-20 ENCOUNTER — Other Ambulatory Visit: Payer: Self-pay | Admitting: Family Medicine

## 2023-04-20 DIAGNOSIS — E119 Type 2 diabetes mellitus without complications: Secondary | ICD-10-CM

## 2023-04-20 DIAGNOSIS — I10 Essential (primary) hypertension: Secondary | ICD-10-CM

## 2023-07-17 ENCOUNTER — Encounter: Payer: Self-pay | Admitting: Family Medicine

## 2023-07-17 ENCOUNTER — Ambulatory Visit: Payer: 59 | Admitting: Family Medicine

## 2023-07-17 VITALS — BP 126/82 | HR 89 | Temp 98.0°F | Ht 71.0 in | Wt 211.0 lb

## 2023-07-17 DIAGNOSIS — E119 Type 2 diabetes mellitus without complications: Secondary | ICD-10-CM | POA: Diagnosis not present

## 2023-07-17 DIAGNOSIS — Z7985 Long-term (current) use of injectable non-insulin antidiabetic drugs: Secondary | ICD-10-CM

## 2023-07-17 DIAGNOSIS — E7800 Pure hypercholesterolemia, unspecified: Secondary | ICD-10-CM | POA: Diagnosis not present

## 2023-07-17 DIAGNOSIS — I10 Essential (primary) hypertension: Secondary | ICD-10-CM

## 2023-07-17 DIAGNOSIS — Z23 Encounter for immunization: Secondary | ICD-10-CM

## 2023-07-17 LAB — COMPREHENSIVE METABOLIC PANEL
ALT: 24 U/L (ref 0–53)
AST: 22 U/L (ref 0–37)
Albumin: 4.7 g/dL (ref 3.5–5.2)
Alkaline Phosphatase: 81 U/L (ref 39–117)
BUN: 19 mg/dL (ref 6–23)
CO2: 26 meq/L (ref 19–32)
Calcium: 9.3 mg/dL (ref 8.4–10.5)
Chloride: 100 meq/L (ref 96–112)
Creatinine, Ser: 0.95 mg/dL (ref 0.40–1.50)
GFR: 91.23 mL/min (ref >60.00)
Glucose, Bld: 144 mg/dL — ABNORMAL HIGH (ref 70–99)
Potassium: 4.2 meq/L (ref 3.5–5.1)
Sodium: 135 meq/L (ref 135–145)
Total Bilirubin: 1.3 mg/dL — ABNORMAL HIGH (ref 0.2–1.2)
Total Protein: 7.6 g/dL (ref 6.0–8.3)

## 2023-07-17 LAB — URINALYSIS, ROUTINE W REFLEX MICROSCOPIC
Bilirubin Urine: NEGATIVE
Hgb urine dipstick: NEGATIVE
Ketones, ur: 15 — AB
Leukocytes,Ua: NEGATIVE
Nitrite: NEGATIVE
RBC / HPF: NONE SEEN (ref 0–?)
Specific Gravity, Urine: 1.02 (ref 1.000–1.030)
Total Protein, Urine: NEGATIVE
Urine Glucose: >=1000 — AB
Urobilinogen, UA: 0.2 (ref 0.0–1.0)
pH: 5.5 (ref 5.0–8.0)

## 2023-07-17 LAB — MICROALBUMIN / CREATININE URINE RATIO
Creatinine,U: 71.3 mg/dL
Microalb Creat Ratio: 1 mg/g (ref 0.0–30.0)
Microalb, Ur: <0.7 mg/dL (ref 0.0–1.9)

## 2023-07-17 LAB — LIPID PANEL
Cholesterol: 114 mg/dL (ref 0–200)
HDL: 35.4 mg/dL — ABNORMAL LOW (ref >39.00)
LDL Cholesterol: 59 mg/dL (ref 0–99)
NonHDL: 78.37
Total CHOL/HDL Ratio: 3
Triglycerides: 96 mg/dL (ref 0.0–149.0)
VLDL: 19.2 mg/dL (ref 0.0–40.0)

## 2023-07-17 LAB — HEMOGLOBIN A1C: Hgb A1c MFr Bld: 7.7 % — ABNORMAL HIGH (ref 4.6–6.5)

## 2023-07-17 NOTE — Progress Notes (Signed)
 Established Patient Office Visit   Subjective:  Patient ID: Eric Sellers, male    DOB: [birth year]  Age: 53 y.o. MRN: [id number]  Chief Complaint  Patient presents with   Medical Management of Chronic Issues    14 week follow up. Pt is fasting. Lost 11 pounds.     HPI Encounter Diagnoses  Name Primary?   Elevated LDL cholesterol level Yes   Essential hypertension    Type 2 diabetes mellitus without complication, without long-term current use of insulin (HCC)    Immunization due    For follow-up of above.  Compliant with all medications.  Tolerating higher dose of semaglutide  without abdominal pain.  Has been able to lose 10 pounds.  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   Review of Systems  Constitutional: Negative.   HENT: Negative.    Eyes:  Negative for blurred vision, discharge and redness.  Respiratory: Negative.    Cardiovascular: Negative.   Gastrointestinal:  Negative for abdominal pain.  Genitourinary: Negative.   Musculoskeletal: Negative.  Negative for myalgias.  Skin:  Negative for rash.  Neurological:  Negative for tingling, loss of consciousness and weakness.  Endo/Heme/Allergies:  Negative for polydipsia.     Current Outpatient Medications:    aspirin EC 81 MG tablet, Take 81 mg by mouth daily. Swallow whole., Disp: , Rfl:    atorvastatin  (LIPITOR) 20 MG tablet, TAKE 1 TABLET BY MOUTH ONCE  DAILY, Disp: 90 tablet, Rfl: 3   Blood Glucose Monitoring Suppl (CONTOUR BLOOD GLUCOSE SYSTEM) w/Device KIT, Use to check glucose TID., Disp: 1 each, Rfl: 0   clindamycin (CLEOCIN) 300 MG capsule, Take 300 mg by mouth every 6 (six) hours., Disp: , Rfl:    CONTOUR NEXT TEST test strip, USE TO CHECK GLUCOSE 3 TIMES A DAY, Disp: 100 strip, Rfl: 12   FARXIGA  10 MG TABS tablet, TAKE 1 TABLET BY MOUTH DAILY  BEFORE BREAKFAST, Disp: 90 tablet, Rfl: 3   glucose blood (ONE TOUCH ULTRA TEST) test strip, 1 each by Other  route 2 (two) times daily as needed for other., Disp: 100 each, Rfl: 5   lisinopril  (ZESTRIL ) 20 MG tablet, TAKE 1 TABLET BY MOUTH DAILY, Disp: 90 tablet, Rfl: 3   metFORMIN  (GLUCOPHAGE ) 1000 MG tablet, TAKE 1 TABLET BY MOUTH TWICE  DAILY WITH MEALS, Disp: 180 tablet, Rfl: 3   ONETOUCH DELICA LANCETS FINE MISC, Inject 1 each into the skin 2 (two) times daily as needed., Disp: 100 each, Rfl: 5   Semaglutide , 2 MG/DOSE, 8 MG/3ML SOPN, Inject 2 mg as directed once a week., Disp: 9 mL, Rfl: 1   OZEMPIC , 1 MG/DOSE, 4 MG/3ML SOPN, INJECT SUBCUTANEOUSLY 1 MG EVERY WEEK (Patient not taking: Reported on 07/17/2023), Disp: 9 mL, Rfl: 3   Objective:     BP 126/82   Pulse 89   Temp 98 F (36.7 C)   Ht 5' 11 (1.803 m)   Wt 211 lb (95.7 kg)   SpO2 98%   BMI 29.43 kg/m  BP Readings from Last 3 Encounters:  07/17/23 126/82  04/10/23 136/82  11/28/22 122/80   Wt Readings from Last 3 Encounters:  07/17/23 211 lb (95.7 kg)  04/10/23 221 lb 9.6 oz (100.5 kg)  11/28/22 208 lb 4.8 oz (94.5 kg)      Physical Exam Constitutional:      General: He is not in acute distress.    Appearance: Normal appearance. He  is not ill-appearing, toxic-appearing or diaphoretic.  HENT:     Head: Normocephalic and atraumatic.     Right Ear: External ear normal.     Left Ear: External ear normal.     Mouth/Throat:     Mouth: Mucous membranes are moist.     Pharynx: Oropharynx is clear. No oropharyngeal exudate or posterior oropharyngeal erythema.  Eyes:     General: No scleral icterus.       Right eye: No discharge.        Left eye: No discharge.     Extraocular Movements: Extraocular movements intact.     Conjunctiva/sclera: Conjunctivae normal.     Pupils: Pupils are equal, round, and reactive to light.  Cardiovascular:     Rate and Rhythm: Normal rate and regular rhythm.  Pulmonary:     Effort: Pulmonary effort is normal. No respiratory distress.     Breath sounds: Normal breath sounds.   Musculoskeletal:     Cervical back: No rigidity or tenderness.  Skin:    General: Skin is warm and dry.  Neurological:     Mental Status: He is alert and oriented to person, place, and time.  Psychiatric:        Mood and Affect: Mood normal.        Behavior: Behavior normal.      No results found for any visits on 07/17/23.    The ASCVD Risk score (Arnett DK, et al., 2019) failed to calculate for the following reasons:   The valid total cholesterol range is 130 to 320 mg/dL    Assessment & Plan:   Elevated LDL cholesterol level -     Comprehensive metabolic panel -     Lipid panel  Essential hypertension -     Urinalysis, Routine w reflex microscopic -     Comprehensive metabolic panel -     Microalbumin / creatinine urine ratio  Type 2 diabetes mellitus without complication, without long-term current use of insulin (HCC) -     Hemoglobin A1c -     Urinalysis, Routine w reflex microscopic -     Microalbumin / creatinine urine ratio  Immunization due -     Pneumococcal conjugate vaccine 20-valent    Return in about 3 months (around 10/15/2023) for chronic disease follow-up, annual physical.    Elsie Sim Lent, MD

## 2023-08-20 ENCOUNTER — Other Ambulatory Visit: Payer: Self-pay | Admitting: Family Medicine

## 2023-08-20 DIAGNOSIS — E119 Type 2 diabetes mellitus without complications: Secondary | ICD-10-CM

## 2023-10-16 ENCOUNTER — Ambulatory Visit: Payer: 59 | Admitting: Family Medicine

## 2023-10-16 ENCOUNTER — Encounter: Payer: Self-pay | Admitting: Family Medicine

## 2023-10-16 VITALS — BP 124/80 | HR 67 | Temp 97.5°F | Ht 71.0 in | Wt 216.0 lb

## 2023-10-16 DIAGNOSIS — E7800 Pure hypercholesterolemia, unspecified: Secondary | ICD-10-CM

## 2023-10-16 DIAGNOSIS — Z7984 Long term (current) use of oral hypoglycemic drugs: Secondary | ICD-10-CM

## 2023-10-16 DIAGNOSIS — E119 Type 2 diabetes mellitus without complications: Secondary | ICD-10-CM | POA: Diagnosis not present

## 2023-10-16 DIAGNOSIS — Z0000 Encounter for general adult medical examination without abnormal findings: Secondary | ICD-10-CM

## 2023-10-16 DIAGNOSIS — Z125 Encounter for screening for malignant neoplasm of prostate: Secondary | ICD-10-CM | POA: Diagnosis not present

## 2023-10-16 DIAGNOSIS — F5101 Primary insomnia: Secondary | ICD-10-CM

## 2023-10-16 DIAGNOSIS — I10 Essential (primary) hypertension: Secondary | ICD-10-CM | POA: Diagnosis not present

## 2023-10-16 DIAGNOSIS — Z1211 Encounter for screening for malignant neoplasm of colon: Secondary | ICD-10-CM

## 2023-10-16 LAB — COMPREHENSIVE METABOLIC PANEL WITH GFR
ALT: 24 U/L (ref 0–53)
AST: 24 U/L (ref 0–37)
Albumin: 4.7 g/dL (ref 3.5–5.2)
Alkaline Phosphatase: 75 U/L (ref 39–117)
BUN: 15 mg/dL (ref 6–23)
CO2: 29 meq/L (ref 19–32)
Calcium: 9.4 mg/dL (ref 8.4–10.5)
Chloride: 99 meq/L (ref 96–112)
Creatinine, Ser: 0.96 mg/dL (ref 0.40–1.50)
GFR: 89.93 mL/min (ref >60.00)
Glucose, Bld: 136 mg/dL — ABNORMAL HIGH (ref 70–99)
Potassium: 4.8 meq/L (ref 3.5–5.1)
Sodium: 136 meq/L (ref 135–145)
Total Bilirubin: 1.4 mg/dL — ABNORMAL HIGH (ref 0.2–1.2)
Total Protein: 7.1 g/dL (ref 6.0–8.3)

## 2023-10-16 LAB — URINALYSIS, ROUTINE W REFLEX MICROSCOPIC
Bilirubin Urine: NEGATIVE
Hgb urine dipstick: NEGATIVE
Leukocytes,Ua: NEGATIVE
Nitrite: NEGATIVE
RBC / HPF: NONE SEEN (ref 0–?)
Specific Gravity, Urine: <=1.005 — AB (ref 1.000–1.030)
Total Protein, Urine: NEGATIVE
Urine Glucose: >=1000 — AB
Urobilinogen, UA: 0.2 (ref 0.0–1.0)
pH: 6 (ref 5.0–8.0)

## 2023-10-16 LAB — CBC WITH DIFFERENTIAL/PLATELET
Basophils Absolute: 0 10*3/uL (ref 0.0–0.1)
Basophils Relative: 0.4 % (ref 0.0–3.0)
Eosinophils Absolute: 0.2 10*3/uL (ref 0.0–0.7)
Eosinophils Relative: 2.8 % (ref 0.0–5.0)
HCT: 49.4 % (ref 39.0–52.0)
Hemoglobin: 17.1 g/dL — ABNORMAL HIGH (ref 13.0–17.0)
Lymphocytes Relative: 31.3 % (ref 12.0–46.0)
Lymphs Abs: 2.2 10*3/uL (ref 0.7–4.0)
MCHC: 34.6 g/dL (ref 30.0–36.0)
MCV: 87.5 fl (ref 78.0–100.0)
Monocytes Absolute: 0.7 10*3/uL (ref 0.1–1.0)
Monocytes Relative: 10.1 % (ref 3.0–12.0)
Neutro Abs: 3.8 10*3/uL (ref 1.4–7.7)
Neutrophils Relative %: 55.4 % (ref 43.0–77.0)
Platelets: 206 10*3/uL (ref 150.0–400.0)
RBC: 5.64 Mil/uL (ref 4.22–5.81)
RDW: 12.9 % (ref 11.5–15.5)
WBC: 6.9 10*3/uL (ref 4.0–10.5)

## 2023-10-16 LAB — LIPID PANEL
Cholesterol: 114 mg/dL (ref 0–200)
HDL: 31.8 mg/dL — ABNORMAL LOW (ref >39.00)
LDL Cholesterol: 57 mg/dL (ref 0–99)
NonHDL: 82.53
Total CHOL/HDL Ratio: 4
Triglycerides: 126 mg/dL (ref 0.0–149.0)
VLDL: 25.2 mg/dL (ref 0.0–40.0)

## 2023-10-16 LAB — MICROALBUMIN / CREATININE URINE RATIO
Creatinine,U: 60.2 mg/dL
Microalb Creat Ratio: UNDETERMINED mg/g (ref 0.0–30.0)
Microalb, Ur: <0.7 mg/dL

## 2023-10-16 LAB — TSH: TSH: 0.82 u[IU]/mL (ref 0.35–5.50)

## 2023-10-16 LAB — PSA: PSA: 0.56 ng/mL (ref 0.10–4.00)

## 2023-10-16 LAB — HEMOGLOBIN A1C: Hgb A1c MFr Bld: 7.3 % — ABNORMAL HIGH (ref 4.6–6.5)

## 2023-10-16 NOTE — Progress Notes (Signed)
 Established Patient Office Visit   Subjective:  Patient ID: Eric Sellers, male    DOB: October 06, 1970  Age: 53 y.o. MRN: [id number]  Chief Complaint  Patient presents with   Annual Exam    CPE. Pt is fasting.     HPI Encounter Diagnoses  Name Primary?   Healthcare maintenance Yes   Screening for colon cancer    Screening for prostate cancer    Essential hypertension    Type 2 diabetes mellitus without complication, without long-term current use of insulin (HCC)    Primary insomnia    Elevated LDL cholesterol level   For physical and follow-up of above.  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  Still manages to exercise some weeks but not others.  He does have regular dental care.  Continues all medicines as indicated above.  Has had some trouble sleeping at times.  Has difficulty staying asleep.  Denies depression or anxiety. Review of Systems  Constitutional: Negative.   HENT: Negative.    Eyes:  Negative for blurred vision, discharge and redness.  Respiratory: Negative.    Cardiovascular: Negative.   Gastrointestinal:  Negative for abdominal pain.  Genitourinary: Negative.   Musculoskeletal: Negative.  Negative for myalgias.  Skin:  Negative for rash.  Neurological:  Negative for tingling, loss of consciousness and weakness.  Endo/Heme/Allergies:  Negative for polydipsia.  Psychiatric/Behavioral:  Negative for depression. The patient has insomnia. The patient is not nervous/anxious.       10/16/2023    8:44 AM 11/28/2022    8:32 AM 08/01/2022    8:17 AM  Depression screen PHQ 2/9  Decreased Interest 0 0 0  Down, Depressed, Hopeless 0 0 0  PHQ - 2 Score 0 0 0  Altered sleeping 0    Tired, decreased energy 0    Change in appetite 0    Feeling bad or failure about yourself  0    Trouble concentrating 0    Moving slowly or fidgety/restless 0    Suicidal thoughts 0    PHQ-9 Score 0    Difficult doing work/chores Not difficult at all        Current  Outpatient Medications:    aspirin EC 81 MG tablet, Take 81 mg by mouth daily. Swallow whole., Disp: , Rfl:    atorvastatin (LIPITOR) 20 MG tablet, TAKE 1 TABLET BY MOUTH ONCE  DAILY, Disp: 90 tablet, Rfl: 3   Blood Glucose Monitoring Suppl (CONTOUR BLOOD GLUCOSE SYSTEM) w/Device KIT, Use to check glucose TID., Disp: 1 each, Rfl: 0   CONTOUR NEXT TEST test strip, USE TO CHECK GLUCOSE 3 TIMES A DAY, Disp: 100 strip, Rfl: 12   FARXIGA 10 MG TABS tablet, TAKE 1 TABLET BY MOUTH DAILY  BEFORE BREAKFAST, Disp: 90 tablet, Rfl: 3   glucose blood (ONE TOUCH ULTRA TEST) test strip, 1 each by Other route 2 (two) times daily as needed for other., Disp: 100 each, Rfl: 5   lisinopril (ZESTRIL) 20 MG tablet, TAKE 1 TABLET BY MOUTH DAILY, Disp: 90 tablet, Rfl: 3   metFORMIN (GLUCOPHAGE) 1000 MG tablet, TAKE 1 TABLET BY MOUTH TWICE  DAILY WITH MEALS, Disp: 180 tablet, Rfl: 3   ONETOUCH DELICA LANCETS FINE MISC, Inject 1 each into the skin 2 (two) times daily as needed., Disp: 100 each, Rfl: 5   OZEMPIC, 2 MG/DOSE, 8 MG/3ML SOPN, INJECT SUBCUTANEOUSLY 2 MG EVERY WEEK AS DIRECTED, Disp: 9 mL, Rfl: 3   clindamycin (CLEOCIN) 300 MG capsule,  Take 300 mg by mouth every 6 (six) hours. (Patient not taking: Reported on 10/16/2023), Disp: , Rfl:    OZEMPIC, 1 MG/DOSE, 4 MG/3ML SOPN, INJECT SUBCUTANEOUSLY 1 MG EVERY WEEK (Patient not taking: Reported on 10/16/2023), Disp: 9 mL, Rfl: 3   Objective:     BP 124/80 (BP Location: Right Arm, Patient Position: Sitting, Cuff Size: Normal)   Pulse 67   Temp (!) 97.5 F (36.4 C) (Temporal)   Ht 5\' 11"  (1.803 m)   Wt 216 lb (98 kg)   SpO2 97%   BMI 30.13 kg/m  BP Readings from Last 3 Encounters:  10/16/23 124/80  07/17/23 126/82  04/10/23 136/82   Wt Readings from Last 3 Encounters:  10/16/23 216 lb (98 kg)  07/17/23 211 lb (95.7 kg)  04/10/23 221 lb 9.6 oz (100.5 kg)      Physical Exam Constitutional:      General: He is not in acute distress.    Appearance: Normal  appearance. He is not ill-appearing, toxic-appearing or diaphoretic.  HENT:     Head: Normocephalic and atraumatic.     Right Ear: External ear normal.     Left Ear: External ear normal.     Mouth/Throat:     Mouth: Mucous membranes are moist.     Pharynx: Oropharynx is clear. No oropharyngeal exudate or posterior oropharyngeal erythema.  Eyes:     General: No scleral icterus.       Right eye: No discharge.        Left eye: No discharge.     Extraocular Movements: Extraocular movements intact.     Conjunctiva/sclera: Conjunctivae normal.     Pupils: Pupils are equal, round, and reactive to light.  Cardiovascular:     Rate and Rhythm: Normal rate and regular rhythm.     Pulses:          Dorsalis pedis pulses are 2+ on the right side and 2+ on the left side.       Posterior tibial pulses are 2+ on the right side and 2+ on the left side.  Pulmonary:     Effort: Pulmonary effort is normal. No respiratory distress.     Breath sounds: Normal breath sounds. No wheezing, rhonchi or rales.  Abdominal:     General: Bowel sounds are normal.     Tenderness: There is no abdominal tenderness. There is no guarding or rebound.     Hernia: There is no hernia in the left inguinal area or right inguinal area.  Genitourinary:    Penis: Circumcised. No hypospadias, erythema, tenderness, discharge, swelling or lesions.      Testes:        Right: Mass, tenderness or swelling not present. Right testis is descended.        Left: Mass, tenderness or swelling not present. Left testis is descended.     Epididymis:     Right: Not inflamed or enlarged.     Left: Not inflamed or enlarged.  Musculoskeletal:     Cervical back: No rigidity or tenderness.  Lymphadenopathy:     Cervical: No cervical adenopathy.     Lower Body: No right inguinal adenopathy. No left inguinal adenopathy.  Skin:    General: Skin is warm and dry.  Neurological:     Mental Status: He is alert and oriented to person, place, and  time.  Psychiatric:        Mood and Affect: Mood normal.        Behavior: Behavior  normal.    Diabetic Foot Exam - Simple   Simple Foot Form Diabetic Foot exam was performed with the following findings: Yes 10/16/2023  8:49 AM  Visual Inspection See comments: Yes Sensation Testing See comments: Yes Pulse Check Posterior Tibialis and Dorsalis pulse intact bilaterally: Yes Comments Feet are cavus bilaterally without lesions or rashes.  Birthmark is noted previously in the right arch.  Sensory intact to light touch.       No results found for any visits on 10/16/23.    The ASCVD Risk score (Arnett DK, et al., 2019) failed to calculate for the following reasons:   The valid total cholesterol range is 130 to 320 mg/dL    Assessment & Plan:   Healthcare maintenance -     CBC with Differential/Platelet  Screening for colon cancer -     Ambulatory referral to Gastroenterology  Screening for prostate cancer -     PSA  Essential hypertension -     Comprehensive metabolic panel with GFR  Type 2 diabetes mellitus without complication, without long-term current use of insulin (HCC) -     Comprehensive metabolic panel with GFR -     Hemoglobin A1c -     Urinalysis, Routine w reflex microscopic -     Microalbumin / creatinine urine ratio  Primary insomnia -     TSH  Elevated LDL cholesterol level -     Comprehensive metabolic panel with GFR -     Lipid panel    Return in about 4 months (around 02/15/2024).  Will see his eye doctor for retinal exam in the next 2 or 3 months.  Encouraged ongoing physical activity and weight loss.  He will try up to 10 mg of melatonin for insomnia.  Information on sleep hygiene given.  Continue all medications as above.  Referral for his first colonoscopy.  Information on health maintenance and disease prevention given.  Mliss Sax, MD

## 2023-10-19 ENCOUNTER — Encounter: Payer: Self-pay | Admitting: Family Medicine

## 2023-11-05 ENCOUNTER — Encounter: Payer: Self-pay | Admitting: Internal Medicine

## 2023-12-09 ENCOUNTER — Other Ambulatory Visit: Payer: Self-pay | Admitting: Family Medicine

## 2023-12-09 DIAGNOSIS — E119 Type 2 diabetes mellitus without complications: Secondary | ICD-10-CM

## 2023-12-09 DIAGNOSIS — E7800 Pure hypercholesterolemia, unspecified: Secondary | ICD-10-CM

## 2023-12-10 ENCOUNTER — Other Ambulatory Visit: Payer: Self-pay

## 2023-12-25 ENCOUNTER — Encounter: Payer: Self-pay | Admitting: Internal Medicine

## 2023-12-25 ENCOUNTER — Ambulatory Visit

## 2023-12-25 VITALS — Ht 71.0 in | Wt 205.0 lb

## 2023-12-25 DIAGNOSIS — Z1211 Encounter for screening for malignant neoplasm of colon: Secondary | ICD-10-CM

## 2023-12-25 MED ORDER — NA SULFATE-K SULFATE-MG SULF 17.5-3.13-1.6 GM/177ML PO SOLN
1.0000 | Freq: Once | ORAL | 0 refills | Status: AC
Start: 2023-12-25 — End: 2023-12-25

## 2023-12-25 NOTE — Progress Notes (Signed)

## 2024-01-02 ENCOUNTER — Other Ambulatory Visit: Payer: Self-pay | Admitting: Family Medicine

## 2024-01-02 DIAGNOSIS — E119 Type 2 diabetes mellitus without complications: Secondary | ICD-10-CM

## 2024-01-14 ENCOUNTER — Telehealth: Payer: Self-pay | Admitting: Internal Medicine

## 2024-01-14 NOTE — Telephone Encounter (Signed)
 Has taken first half of prep and no stool yet. No enema at house.  Baseline 2 BM's a week since Ozempic   Advised to take second half of prep and we will see how things are in AM before procedure

## 2024-01-15 ENCOUNTER — Ambulatory Visit (AMBULATORY_SURGERY_CENTER): Admitting: Internal Medicine

## 2024-01-15 ENCOUNTER — Encounter: Payer: Self-pay | Admitting: Internal Medicine

## 2024-01-15 VITALS — BP 121/73 | HR 70 | Temp 98.1°F | Resp 16 | Ht 71.0 in | Wt 205.0 lb

## 2024-01-15 DIAGNOSIS — Z1211 Encounter for screening for malignant neoplasm of colon: Secondary | ICD-10-CM | POA: Diagnosis present

## 2024-01-15 DIAGNOSIS — D125 Benign neoplasm of sigmoid colon: Secondary | ICD-10-CM | POA: Diagnosis not present

## 2024-01-15 MED ORDER — SODIUM CHLORIDE 0.9 % IV SOLN: 500.0000 mL | Freq: Once | INTRAVENOUS | Status: DC

## 2024-01-15 NOTE — Telephone Encounter (Signed)
 Called and spoke with patient. He stated he was able to complete second half of prep as directed. Last BM was all liquid, no solid pieces reported. Stool was murky brown. Let pt know we will notify MD, and proceed as recommended. He verbalized understanding, and will arrive at 7am for procedure.

## 2024-01-15 NOTE — Progress Notes (Signed)
 [facility] History and Physical   Primary Care Physician:  Berneta Elsie Sayre, MD   Reason for Procedure:    Encounter Diagnosis  Name Primary?   Special screening for malignant neoplasms, colon Yes     Plan:    colonoscopy     HPI: Eric Sellers is a 53 y.o. male here for a screening colonoscopy exam   Past Medical History:  Diagnosis Date   Diabetes mellitus without complication (HCC)    History of kidney stones    Hyperlipidemia    Hypertension     Past Surgical History:  Procedure Laterality Date   TONSILLECTOMY       Current Outpatient Medications  Medication Sig Dispense Refill   aspirin EC 81 MG tablet Take 81 mg by mouth daily. Swallow whole.     atorvastatin  (LIPITOR) 20 MG tablet TAKE 1 TABLET BY MOUTH ONCE  DAILY 90 tablet 3   Blood Glucose Monitoring Suppl (CONTOUR BLOOD GLUCOSE SYSTEM) w/Device KIT Use to check glucose TID. 1 each 0   CONTOUR NEXT TEST test strip USE TO CHECK GLUCOSE 3 TIMES A DAY 100 strip 12   FARXIGA  10 MG TABS tablet TAKE 1 TABLET BY MOUTH DAILY  BEFORE BREAKFAST 90 tablet 3   glucose blood (ONE TOUCH ULTRA TEST) test strip 1 each by Other route 2 (two) times daily as needed for other. 100 each 5   lisinopril  (ZESTRIL ) 20 MG tablet TAKE 1 TABLET BY MOUTH DAILY 90 tablet 3   Melatonin 10 MG CAPS Take by mouth.     metFORMIN  (GLUCOPHAGE ) 1000 MG tablet TAKE 1 TABLET BY MOUTH TWICE  DAILY WITH MEALS 180 tablet 3   ONETOUCH DELICA LANCETS FINE MISC Inject 1 each into the skin 2 (two) times daily as needed. 100 each 5   OZEMPIC , 1 MG/DOSE, 4 MG/3ML SOPN INJECT SUBCUTANEOUSLY 1 MG EVERY WEEK (Patient not taking: Reported on 01/15/2024) 9 mL 3   OZEMPIC , 2 MG/DOSE, 8 MG/3ML SOPN INJECT SUBCUTANEOUSLY 2 MG EVERY WEEK AS DIRECTED 9 mL 3   Current Facility-Administered Medications  Medication Dose Route Frequency Provider Last Rate Last Admin   0.9 %  sodium chloride  infusion  500 mL Intravenous Once Eric Lupita BRAVO, MD         Allergies as of 01/15/2024 - Review Complete 01/15/2024  Allergen Reaction Noted   Penicillins Other (See Comments) 08/31/2013    Family History  Problem Relation Age of Onset   Diabetes Mother    Hypertension Mother    Diabetes Father    Hypertension Father    Prostate cancer Father    Colon cancer Neg Hx    Colon polyps Neg Hx    Esophageal cancer Neg Hx    Rectal cancer Neg Hx    Stomach cancer Neg Hx     Social History   Socioeconomic History   Marital status: Single    Spouse name: Not on file   Number of children: Not on file   Years of education: Not on file   Highest education level: Not on file  Occupational History   Not on file  Tobacco Use   Smoking status: Never   Smokeless tobacco: Never  Vaping Use   Vaping status: Never Used  Substance and Sexual Activity   Alcohol use: Yes    Alcohol/week: 5.0 standard drinks of alcohol    Types: 5 Cans of beer per week    Comment: occasional   Drug use: No   Sexual  activity: Yes  Other Topics Concern   Not on file  Social History Narrative   Not on file   Social Drivers of Health   Financial Resource Strain: Not on file  Food Insecurity: Not on file  Transportation Needs: Not on file  Physical Activity: Not on file  Stress: Not on file  Social Connections: Not on file  Intimate Partner Violence: Not on file    Review of Systems:  All other review of systems negative except as mentioned in the HPI.  Physical Exam: Vital signs BP 136/87   Pulse 93   Temp 98.1 F (36.7 C) (Skin)   Resp 11   Ht 5' 11 (1.803 m)   Wt 205 lb (93 kg)   SpO2 100%   BMI 28.59 kg/m   General:   Alert,  Well-developed, well-nourished, pleasant and cooperative in NAD Lungs:  Clear throughout to auscultation.   Heart:  Regular rate and rhythm; no murmurs, clicks, rubs,  or gallops. Abdomen:  Soft, nontender and nondistended. Normal bowel sounds.   Neuro/Psych:  Alert and cooperative. Normal mood and affect. A  and O x 3   @[name]  [name], MD, [facility] [phone] (pager) 01/15/2024 3:17 PM@

## 2024-01-15 NOTE — Op Note (Signed)
 [facility] Patient Name: Eric Sellers Procedure Date: 01/15/2024 8:00 AM MRN: [id number] Endoscopist: Lupita FORBES Commander , MD, [id number] Age: 53 Referring MD:  Date of Birth: [birth year] Gender: Male Account #: [id number] Procedure:                Colonoscopy Indications:              Screening for colorectal malignant neoplasm Medicines:                Monitored Anesthesia Care Procedure:                Pre-Anesthesia Assessment:                           - Prior to the procedure, a History and Physical                            was performed, and patient medications and                            allergies were reviewed. The patient's tolerance of                            previous anesthesia was also reviewed. The risks                            and benefits of the procedure and the sedation                            options and risks were discussed with the patient.                            All questions were answered, and informed consent                            was obtained. Prior Anticoagulants: The patient has                            taken no anticoagulant or antiplatelet agents. ASA                            Grade Assessment: II - A patient with mild systemic                            disease. After reviewing the risks and benefits,                            the patient was deemed in satisfactory condition to                            undergo the procedure.                           After obtaining informed consent, the colonoscope  was passed under direct vision. Throughout the                            procedure, the patient's blood pressure, pulse, and                            oxygen saturations were monitored continuously. The                            CF [id number] #[id number] was introduced through the anus                            and advanced to the the cecum, identified by                            appendiceal  orifice and ileocecal valve. The                            colonoscopy was performed without difficulty. The                            patient tolerated the procedure well. The quality                            of the bowel preparation was adequate. The                            ileocecal valve, appendiceal orifice, and rectum                            were photographed. The bowel preparation used was                            SUPREP via extended prep with split dose                            instruction. Scope In: 3:27:08 PM Scope Out: 3:48:34 PM Scope Withdrawal Time: 0 hours 18 minutes 50 seconds  Total Procedure Duration: 0 hours 21 minutes 26 seconds  Findings:                 The perianal and digital rectal examinations were                            normal. Pertinent negatives include normal prostate                            (size, shape, and consistency).                           Two sessile polyps were found in the distal sigmoid                            colon. The polyps were 2 to 6 mm in size. These  polyps were removed with a cold snare. Resection                            and retrieval were complete. Verification of                            patient identification for the specimen was done.                            Estimated blood loss was minimal.                           The exam was otherwise without abnormality on                            direct and retroflexion views. Complications:            No immediate complications. Estimated Blood Loss:     Estimated blood loss was minimal. Impression:               - Two 2 to 6 mm polyps in the distal sigmoid colon,                            removed with a cold snare. Resected and retrieved.                           - The examination was otherwise normal on direct                            and retroflexion views. Recommendation:           - Patient has a contact number available  for                            emergencies. The signs and symptoms of potential                            delayed complications were discussed with the                            patient. Return to normal activities tomorrow.                            Written discharge instructions were provided to the                            patient.                           - Resume previous diet.                           - Continue present medications.                           - Repeat colonoscopy is recommended. The  colonoscopy date will be determined after pathology                            results from today's exam become available for                            review. If still on GLP 1 will need double prep -                            had delayed response to Suprep and inadequate -                            took Miralax this AM after that to achieve adequate                            prep Lupita FORBES Commander, MD 01/15/2024 3:57:06 PM This report has been signed electronically.

## 2024-01-15 NOTE — Progress Notes (Signed)
 Report to PACU, RN, vss, BBS= Clear.

## 2024-01-15 NOTE — Patient Instructions (Addendum)
 I found and removed 2 small polyps that look benign.  I will let you know pathology results and when to have another routine colonoscopy by mail and/or My Chart.  I appreciate the opportunity to care for you.  Lupita [name], MD, FACG   YOU HAD AN ENDOSCOPIC PROCEDURE TODAY AT THE [facility]:   Refer to the procedure report that was given to you for any specific questions about what was found during the examination.  If the procedure report does not answer your questions, please call your gastroenterologist to clarify.  If you requested that your care partner not be given the details of your procedure findings, then the procedure report has been included in a sealed envelope for you to review at your convenience later.  YOU SHOULD EXPECT: Some feelings of bloating in the abdomen. Passage of more gas than usual.  Walking can help get rid of the air that was put into your GI tract during the procedure and reduce the bloating. If you had a lower endoscopy (such as a colonoscopy or flexible sigmoidoscopy) you may notice spotting of blood in your stool or on the toilet paper. If you underwent a bowel prep for your procedure, you may not have a normal bowel movement for a few days.  Please Note:  You might notice some irritation and congestion in your nose or some drainage.  This is from the oxygen used during your procedure.  There is no need for concern and it should clear up in a day or so.  SYMPTOMS TO REPORT IMMEDIATELY:  Following lower endoscopy (colonoscopy or flexible sigmoidoscopy):  Excessive amounts of blood in the stool  Significant tenderness or worsening of abdominal pains  Swelling of the abdomen that is new, acute  Fever of 100F or higher   For urgent or emergent issues, a gastroenterologist can be reached at any hour by calling [phone]. Do not use MyChart messaging for urgent concerns.    DIET:  We do recommend a small meal at first, but then you may  proceed to your regular diet.  Drink plenty of fluids but you should avoid alcoholic beverages for 24 hours.  ACTIVITY:  You should plan to take it easy for the rest of today and you should NOT DRIVE or use heavy machinery until tomorrow (because of the sedation medicines used during the test).    FOLLOW UP: Our staff will call the number listed on your records the next business day following your procedure.  We will call around 7:15- 8:00 am to check on you and address any questions or concerns that you may have regarding the information given to you following your procedure. If we do not reach you, we will leave a message.     If any biopsies were taken you will be contacted by phone or by letter within the next 1-3 weeks.  Please call us  at [phone] if you have not heard about the biopsies in 3 weeks.    SIGNATURES/CONFIDENTIALITY: You and/or your care partner have signed paperwork which will be entered into your electronic medical record.  These signatures attest to the fact that that the information above on your After Visit Summary has been reviewed and is understood.  Full responsibility of the confidentiality of this discharge information lies with you and/or your care-partner.

## 2024-01-15 NOTE — Progress Notes (Signed)
 VS by DT  Pt's states no medical or surgical changes since previsit or office visit.

## 2024-01-18 ENCOUNTER — Telehealth: Payer: Self-pay

## 2024-01-18 NOTE — Telephone Encounter (Signed)
 No answer after follow up call. Voice message left.

## 2024-01-20 LAB — SURGICAL PATHOLOGY

## 2024-01-24 ENCOUNTER — Ambulatory Visit: Payer: Self-pay | Admitting: Internal Medicine

## 2024-01-24 DIAGNOSIS — Z860101 Personal history of adenomatous and serrated colon polyps: Secondary | ICD-10-CM | POA: Insufficient documentation

## 2024-02-19 ENCOUNTER — Encounter: Payer: Self-pay | Admitting: Family Medicine

## 2024-02-19 ENCOUNTER — Ambulatory Visit: Admitting: Family Medicine

## 2024-02-19 ENCOUNTER — Ambulatory Visit: Payer: Self-pay | Admitting: Family Medicine

## 2024-02-19 VITALS — BP 118/70 | HR 75 | Temp 97.5°F | Ht 71.0 in | Wt 207.0 lb

## 2024-02-19 DIAGNOSIS — I10 Essential (primary) hypertension: Secondary | ICD-10-CM | POA: Diagnosis not present

## 2024-02-19 DIAGNOSIS — E119 Type 2 diabetes mellitus without complications: Secondary | ICD-10-CM | POA: Diagnosis not present

## 2024-02-19 DIAGNOSIS — Z7985 Long-term (current) use of injectable non-insulin antidiabetic drugs: Secondary | ICD-10-CM

## 2024-02-19 LAB — BASIC METABOLIC PANEL WITH GFR
BUN: 16 mg/dL (ref 6–23)
CO2: 27 meq/L (ref 19–32)
Calcium: 8.9 mg/dL (ref 8.4–10.5)
Chloride: 102 meq/L (ref 96–112)
Creatinine, Ser: 0.89 mg/dL (ref 0.40–1.50)
GFR: 97.26 mL/min (ref >60.00)
Glucose, Bld: 151 mg/dL — ABNORMAL HIGH (ref 70–99)
Potassium: 4 meq/L (ref 3.5–5.1)
Sodium: 137 meq/L (ref 135–145)

## 2024-02-19 LAB — HEMOGLOBIN A1C: Hgb A1c MFr Bld: 7.8 % — ABNORMAL HIGH (ref 4.6–6.5)

## 2024-02-19 NOTE — Progress Notes (Addendum)
 Every year  Established Patient Office Visit   Subjective:  Patient ID: Eric Sellers, male    DOB: 1970-11-16  Age: 53 y.o. MRN: [id number]  Chief Complaint  Patient presents with   Medical Management of Chronic Issues    4 months follow up. Pt is fasting. Still needs eye exam.     HPI Encounter Diagnoses  Name Primary?   Type 2 diabetes mellitus without complication, without long-term current use of insulin (HCC) Yes   Essential hypertension    For follow-up of above.  Blood pressure well-controlled with lisinopril 20.  Continues Ozempic 2 mg weekly, Farxiga 10 mg daily and metformin 1000 twice daily for suboptimally controlled diabetes.  Has not had his eyes checked yet.  Status post recent colonoscopy.  Recently restarted back at the gym.   Review of Systems  Constitutional: Negative.   HENT: Negative.    Eyes:  Negative for blurred vision, discharge and redness.  Respiratory: Negative.    Cardiovascular: Negative.   Gastrointestinal:  Negative for abdominal pain.  Genitourinary: Negative.   Musculoskeletal: Negative.  Negative for myalgias.  Skin:  Negative for rash.  Neurological:  Negative for tingling, loss of consciousness and weakness.  Endo/Heme/Allergies:  Negative for polydipsia.     Current Outpatient Medications:    aspirin EC 81 MG tablet, Take 81 mg by mouth daily. Swallow whole., Disp: , Rfl:    atorvastatin (LIPITOR) 20 MG tablet, TAKE 1 TABLET BY MOUTH ONCE  DAILY, Disp: 90 tablet, Rfl: 3   Blood Glucose Monitoring Suppl (CONTOUR BLOOD GLUCOSE SYSTEM) w/Device KIT, Use to check glucose TID., Disp: 1 each, Rfl: 0   CONTOUR NEXT TEST test strip, USE TO CHECK GLUCOSE 3 TIMES A DAY, Disp: 100 strip, Rfl: 12   FARXIGA 10 MG TABS tablet, TAKE 1 TABLET BY MOUTH DAILY  BEFORE BREAKFAST, Disp: 90 tablet, Rfl: 3   glucose blood (ONE TOUCH ULTRA TEST) test strip, 1 each by Other route 2 (two) times daily as needed for other., Disp: 100 each, Rfl: 5   lisinopril  (ZESTRIL) 20 MG tablet, TAKE 1 TABLET BY MOUTH DAILY, Disp: 90 tablet, Rfl: 3   Melatonin 10 MG CAPS, Take by mouth., Disp: , Rfl:    metFORMIN (GLUCOPHAGE) 1000 MG tablet, TAKE 1 TABLET BY MOUTH TWICE  DAILY WITH MEALS, Disp: 180 tablet, Rfl: 3   ONETOUCH DELICA LANCETS FINE MISC, Inject 1 each into the skin 2 (two) times daily as needed., Disp: 100 each, Rfl: 5   OZEMPIC, 2 MG/DOSE, 8 MG/3ML SOPN, INJECT SUBCUTANEOUSLY 2 MG EVERY WEEK AS DIRECTED, Disp: 9 mL, Rfl: 3   Objective:     BP 118/70 (BP Location: Right Arm, Patient Position: Sitting, Cuff Size: Normal)   Pulse 75   Temp (!) 97.5 F (36.4 C) (Temporal)   Ht 5' 11 (1.803 m)   Wt 207 lb (93.9 kg)   SpO2 96%   BMI 28.87 kg/m  BP Readings from Last 3 Encounters:  02/19/24 118/70  01/15/24 121/73  10/16/23 124/80   Wt Readings from Last 3 Encounters:  02/19/24 207 lb (93.9 kg)  01/15/24 205 lb (93 kg)  12/25/23 205 lb (93 kg)      Physical Exam Constitutional:      General: He is not in acute distress.    Appearance: Normal appearance. He is not ill-appearing, toxic-appearing or diaphoretic.  HENT:     Head: Normocephalic and atraumatic.     Right Ear: External ear normal.  Left Ear: External ear normal.  Eyes:     General: No scleral icterus.       Right eye: No discharge.        Left eye: No discharge.     Extraocular Movements: Extraocular movements intact.     Conjunctiva/sclera: Conjunctivae normal.  Pulmonary:     Effort: Pulmonary effort is normal. No respiratory distress.  Skin:    General: Skin is warm and dry.  Neurological:     Mental Status: He is alert and oriented to person, place, and time.  Psychiatric:        Mood and Affect: Mood normal.        Behavior: Behavior normal.      Results for orders placed or performed in visit on 02/19/24  Basic metabolic panel with GFR  Result Value Ref Range   Sodium 137 135 - 145 mEq/L   Potassium 4.0 3.5 - 5.1 mEq/L   Chloride 102 96 - 112  mEq/L   CO2 27 19 - 32 mEq/L   Glucose, Bld 151 (H) 70 - 99 mg/dL   BUN 16 6 - 23 mg/dL   Creatinine, Ser 9.10 0.40 - 1.50 mg/dL   GFR 02.73 >39.99 mL/min   Calcium 8.9 8.4 - 10.5 mg/dL  Hemoglobin J8r  Result Value Ref Range   Hgb A1c MFr Bld 7.8 (H) 4.6 - 6.5 %      The ASCVD Risk score (Arnett DK, et al., 2019) failed to calculate for the following reasons:   The valid total cholesterol range is 130 to 320 mg/dL    Assessment & Plan:   Type 2 diabetes mellitus without complication, without long-term current use of insulin (HCC) -     Basic metabolic panel with GFR -     Hemoglobin A1c  Essential hypertension -     Basic metabolic panel with GFR    Return in about 4 months (around 06/20/2024), or Please have retina exam.  Warned about risk of sarcopenia.  Patient will continue gym work and weight loss efforts.  Will switch to tirzepatide after he is completed his current Ozempic dosing.    Elsie Sim Lent, MD

## 2024-03-28 ENCOUNTER — Encounter: Payer: Self-pay | Admitting: Family Medicine

## 2024-03-28 ENCOUNTER — Other Ambulatory Visit: Payer: Self-pay | Admitting: Family Medicine

## 2024-03-28 DIAGNOSIS — I10 Essential (primary) hypertension: Secondary | ICD-10-CM

## 2024-03-28 DIAGNOSIS — E119 Type 2 diabetes mellitus without complications: Secondary | ICD-10-CM

## 2024-03-28 DIAGNOSIS — E66811 Obesity, class 1: Secondary | ICD-10-CM

## 2024-03-30 MED ORDER — TIRZEPATIDE 7.5 MG/0.5ML ~~LOC~~ SOAJ: 7.5000 mg | SUBCUTANEOUS | 1 refills | Status: DC

## 2024-05-23 ENCOUNTER — Encounter: Payer: Self-pay | Admitting: Family Medicine

## 2024-05-23 ENCOUNTER — Other Ambulatory Visit: Payer: Self-pay

## 2024-05-23 DIAGNOSIS — E66811 Obesity, class 1: Secondary | ICD-10-CM

## 2024-05-23 DIAGNOSIS — E119 Type 2 diabetes mellitus without complications: Secondary | ICD-10-CM

## 2024-05-23 MED ORDER — TIRZEPATIDE 7.5 MG/0.5ML ~~LOC~~ SOAJ: 7.5000 mg | SUBCUTANEOUS | 1 refills | Status: DC

## 2024-05-31 LAB — OPHTHALMOLOGY REPORT-SCANNED

## 2024-07-01 ENCOUNTER — Ambulatory Visit: Admitting: Family Medicine

## 2024-07-01 ENCOUNTER — Encounter: Payer: Self-pay | Admitting: Family Medicine

## 2024-07-01 VITALS — BP 125/76 | HR 87 | Temp 97.6°F | Ht 71.0 in | Wt 201.4 lb

## 2024-07-01 DIAGNOSIS — Z7985 Long-term (current) use of injectable non-insulin antidiabetic drugs: Secondary | ICD-10-CM

## 2024-07-01 DIAGNOSIS — E7800 Pure hypercholesterolemia, unspecified: Secondary | ICD-10-CM | POA: Diagnosis not present

## 2024-07-01 DIAGNOSIS — I10 Essential (primary) hypertension: Secondary | ICD-10-CM

## 2024-07-01 DIAGNOSIS — E1165 Type 2 diabetes mellitus with hyperglycemia: Secondary | ICD-10-CM | POA: Diagnosis not present

## 2024-07-01 LAB — BASIC METABOLIC PANEL WITH GFR
BUN: 14 mg/dL (ref 6–23)
CO2: 25 meq/L (ref 19–32)
Calcium: 9.3 mg/dL (ref 8.4–10.5)
Chloride: 100 meq/L (ref 96–112)
Creatinine, Ser: 0.96 mg/dL (ref 0.40–1.50)
GFR: 89.48 mL/min
Glucose, Bld: 131 mg/dL — ABNORMAL HIGH (ref 70–99)
Potassium: 4.6 meq/L (ref 3.5–5.1)
Sodium: 135 meq/L (ref 135–145)

## 2024-07-01 LAB — LDL CHOLESTEROL, DIRECT: Direct LDL: 55 mg/dL

## 2024-07-01 NOTE — Progress Notes (Signed)
 "  Established Patient Office Visit   Subjective:  Patient ID: Eric Sellers, male    DOB: 06-May-1971  Age: 53 y.o. MRN: [id number]  Chief Complaint  Patient presents with   Medical Management of Chronic Issues    Pt presents today for  f/u for his A1C. 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     HPI Encounter Diagnoses  Name Primary?   Uncontrolled type 2 diabetes mellitus with hyperglycemia (HCC) Yes   Essential hypertension    Elevated LDL cholesterol level    Follow-up of above.  Has only been taking the tirzepatide  for 5 weeks because he wanted to finish out the semaglutide .  Less GI side effect with the tirzepatide .  Fasting sugars are improving.  He has been able to lose some weight.  Blood pressure remains well-controlled on current therapy.  Continues atorvastatin  for well-controlled cholesterol.  Continues regular exercise.  Accompanied by his significant other Wanda today.  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   HENT: Negative.    Eyes:  Negative for blurred vision, discharge and redness.  Respiratory: Negative.    Cardiovascular: Negative.   Gastrointestinal:  Negative for abdominal pain.  Genitourinary: Negative.   Musculoskeletal: Negative.  Negative for myalgias.  Skin:  Negative for rash.  Neurological:  Negative for tingling, loss of consciousness and weakness.  Endo/Heme/Allergies:  Negative for polydipsia.      07/01/2024    8:21 AM 02/19/2024    8:06 AM 10/16/2023    8:44 AM  Depression screen PHQ 2/9  Decreased Interest 0 0 0  Down, Depressed, Hopeless 0 0 0  PHQ - 2 Score 0 0 0  Altered sleeping   0  Tired, decreased energy   0  Change in appetite   0  Feeling bad or failure about yourself    0  Trouble concentrating   0  Moving slowly or fidgety/restless   0  Suicidal thoughts   0  PHQ-9 Score   0   Difficult doing work/chores   Not difficult at all     Data  saved with a previous flowsheet row definition      Current Medications[1]   Objective:     BP 125/76   Pulse 87   Temp 97.6 F (36.4 C)   Ht 5' 11 (1.803 m)   Wt 201 lb 6.4 oz (91.4 kg)   SpO2 98%   BMI 28.09 kg/m  Wt Readings from Last 3 Encounters:  07/01/24 201 lb 6.4 oz (91.4 kg)  02/19/24 207 lb (93.9 kg)  01/15/24 205 lb (93 kg)      Physical Exam Constitutional:      General: He is not in acute distress.    Appearance: Normal appearance. He is not ill-appearing, toxic-appearing or diaphoretic.  HENT:     Head: Normocephalic and atraumatic.     Right Ear: External ear normal.     Left Ear: External ear normal.  Eyes:     General: No scleral icterus.       Right eye: No discharge.        Left eye: No discharge.     Extraocular Movements: Extraocular movements intact.     Conjunctiva/sclera: Conjunctivae normal.  Pulmonary:     Effort: Pulmonary effort is normal. No respiratory distress.  Skin:    General: Skin is warm and dry.  Neurological:  Mental Status: He is alert and oriented to person, place, and time.  Psychiatric:        Mood and Affect: Mood normal.        Behavior: Behavior normal.      No results found for any visits on 07/01/24.    The ASCVD Risk score (Arnett DK, et al., 2019) failed to calculate for the following reasons:   The valid total cholesterol range is 130 to 320 mg/dL    Assessment & Plan:   Uncontrolled type 2 diabetes mellitus with hyperglycemia (HCC) -     Basic metabolic panel with GFR -     Hemoglobin A1c  Essential hypertension -     Basic metabolic panel with GFR  Elevated LDL cholesterol level -     LDL cholesterol, direct    Return in about 4 months (around 10/30/2024) for chronic disease follow-up, annual physical.  Continue current medications.  Hold increasing the dose of tirzepatide  for now.  May consider pending results of A1c.  Continue all current medications.  Elsie Sim Lent, MD     [1]  Current Outpatient Medications:    amLODipine  (NORVASC ) 5 MG tablet, Take 5 mg by mouth daily., Disp: , Rfl:    aspirin EC 81 MG tablet, Take 81 mg by mouth daily. Swallow whole., Disp: , Rfl:    atorvastatin  (LIPITOR) 20 MG tablet, TAKE 1 TABLET BY MOUTH ONCE  DAILY, Disp: 90 tablet, Rfl: 3   Blood Glucose Monitoring Suppl (CONTOUR BLOOD GLUCOSE SYSTEM) w/Device KIT, Use to check glucose TID., Disp: 1 each, Rfl: 0   CONTOUR NEXT TEST test strip, USE TO CHECK GLUCOSE 3 TIMES A DAY, Disp: 100 strip, Rfl: 12   FARXIGA  10 MG TABS tablet, TAKE 1 TABLET BY MOUTH DAILY  BEFORE BREAKFAST, Disp: 90 tablet, Rfl: 3   glucose blood (ONE TOUCH ULTRA TEST) test strip, 1 each by Other route 2 (two) times daily as needed for other., Disp: 100 each, Rfl: 5   lisinopril  (ZESTRIL ) 20 MG tablet, TAKE 1 TABLET BY MOUTH DAILY, Disp: 90 tablet, Rfl: 3   Melatonin 10 MG CAPS, Take by mouth., Disp: , Rfl:    metFORMIN  (GLUCOPHAGE ) 1000 MG tablet, TAKE 1 TABLET BY MOUTH TWICE  DAILY WITH MEALS, Disp: 180 tablet, Rfl: 3   ONETOUCH DELICA LANCETS FINE MISC, Inject 1 each into the skin 2 (two) times daily as needed., Disp: 100 each, Rfl: 5   tirzepatide  (MOUNJARO ) 7.5 MG/0.5ML Pen, Inject 7.5 mg into the skin once a week., Disp: 6 mL, Rfl: 1  "

## 2024-07-04 LAB — HEMOGLOBIN A1C: Hgb A1c MFr Bld: 7.5 % — ABNORMAL HIGH (ref 4.6–6.5)

## 2024-07-05 ENCOUNTER — Other Ambulatory Visit: Payer: Self-pay | Admitting: Family Medicine

## 2024-07-05 ENCOUNTER — Ambulatory Visit: Payer: Self-pay | Admitting: Family Medicine

## 2024-07-05 DIAGNOSIS — E1165 Type 2 diabetes mellitus with hyperglycemia: Secondary | ICD-10-CM

## 2024-07-05 MED ORDER — TIRZEPATIDE 10 MG/0.5ML ~~LOC~~ SOAJ: 10.0000 mg | SUBCUTANEOUS | 2 refills | Status: AC

## 2024-10-28 ENCOUNTER — Encounter: Admitting: Family Medicine
